# Patient Record
Sex: Female | Born: 1960 | Race: White | Hispanic: No | Marital: Married | Smoking: Never smoker
Health system: Southern US, Community
[De-identification: ages and names within clinical notes are randomized; demographics above are authoritative.]

## PROBLEM LIST (undated history)

## (undated) DIAGNOSIS — J988 Other specified respiratory disorders: Secondary | ICD-10-CM

## (undated) DIAGNOSIS — E559 Vitamin D deficiency, unspecified: Secondary | ICD-10-CM

## (undated) DIAGNOSIS — M1990 Unspecified osteoarthritis, unspecified site: Secondary | ICD-10-CM

## (undated) DIAGNOSIS — H6690 Otitis media, unspecified, unspecified ear: Secondary | ICD-10-CM

## (undated) DIAGNOSIS — M25559 Pain in unspecified hip: Secondary | ICD-10-CM

## (undated) DIAGNOSIS — K5900 Constipation, unspecified: Secondary | ICD-10-CM

## (undated) DIAGNOSIS — N390 Urinary tract infection, site not specified: Secondary | ICD-10-CM

## (undated) DIAGNOSIS — L0390 Cellulitis, unspecified: Secondary | ICD-10-CM

## (undated) DIAGNOSIS — J329 Chronic sinusitis, unspecified: Secondary | ICD-10-CM

## (undated) DIAGNOSIS — E663 Overweight: Secondary | ICD-10-CM

## (undated) DIAGNOSIS — L0291 Cutaneous abscess, unspecified: Secondary | ICD-10-CM

## (undated) DIAGNOSIS — I10 Essential (primary) hypertension: Secondary | ICD-10-CM

## (undated) DIAGNOSIS — G4700 Insomnia, unspecified: Secondary | ICD-10-CM

## (undated) DIAGNOSIS — F419 Anxiety disorder, unspecified: Secondary | ICD-10-CM

## (undated) HISTORY — DX: Urinary tract infection, site not specified: N39.0

## (undated) HISTORY — DX: Pain in unspecified hip: M25.559

## (undated) HISTORY — DX: Unspecified osteoarthritis, unspecified site: M19.90

## (undated) HISTORY — DX: Essential (primary) hypertension: I10

## (undated) HISTORY — DX: Otitis media, unspecified, unspecified ear: H66.90

## (undated) HISTORY — PX: LIPOSUCTION: SHX10

## (undated) HISTORY — DX: Chronic sinusitis, unspecified: J32.9

## (undated) HISTORY — DX: Cutaneous abscess, unspecified: L02.91

## (undated) HISTORY — DX: Anxiety disorder, unspecified: F41.9

## (undated) HISTORY — PX: REPLACEMENT TOTAL HIP W/  RESURFACING IMPLANTS: SUR1222

## (undated) HISTORY — PX: TUBAL LIGATION: SHX77

## (undated) HISTORY — DX: Insomnia, unspecified: G47.00

## (undated) HISTORY — DX: Other specified respiratory disorders: J98.8

## (undated) HISTORY — DX: Constipation, unspecified: K59.00

## (undated) HISTORY — DX: Vitamin D deficiency, unspecified: E55.9

## (undated) HISTORY — DX: Cellulitis, unspecified: L03.90

## (undated) HISTORY — DX: Overweight: E66.3

---

## 1997-07-06 ENCOUNTER — Other Ambulatory Visit: Admission: RE | Admit: 1997-07-06 | Discharge: 1997-07-06 | Payer: Self-pay | Admitting: Obstetrics and Gynecology

## 2001-06-16 ENCOUNTER — Other Ambulatory Visit: Admission: RE | Admit: 2001-06-16 | Discharge: 2001-06-16 | Payer: Self-pay | Admitting: Obstetrics & Gynecology

## 2011-03-23 ENCOUNTER — Encounter: Payer: Self-pay | Admitting: *Deleted

## 2011-03-23 ENCOUNTER — Encounter: Payer: Self-pay | Admitting: Cardiovascular Disease

## 2011-03-26 ENCOUNTER — Ambulatory Visit (INDEPENDENT_AMBULATORY_CARE_PROVIDER_SITE_OTHER): Payer: BC Managed Care – PPO | Admitting: Cardiovascular Disease

## 2011-03-26 ENCOUNTER — Encounter: Payer: Self-pay | Admitting: Cardiovascular Disease

## 2011-03-26 DIAGNOSIS — F411 Generalized anxiety disorder: Secondary | ICD-10-CM

## 2011-03-26 DIAGNOSIS — R079 Chest pain, unspecified: Secondary | ICD-10-CM | POA: Insufficient documentation

## 2011-03-26 DIAGNOSIS — F419 Anxiety disorder, unspecified: Secondary | ICD-10-CM

## 2011-03-26 DIAGNOSIS — Z7289 Other problems related to lifestyle: Secondary | ICD-10-CM

## 2011-03-26 NOTE — Patient Instructions (Addendum)
Your physician recommends that you schedule a follow-up appointment in: AS NEEDED  Your physician recommends that you continue on your current medications as directed. Please refer to the Current Medication list given to you today.  Your physician has requested that you have a stress echocardiogram. For further information please visit [email or web address]. Please follow instruction sheet as given. DX CHEST PAIN  

## 2011-03-26 NOTE — Progress Notes (Signed)
Patient ID: Brenda Holloway, female   DOB: December 16, 1960, 51 y.o.   MRN: [id number] 51 yo referred by Mittie Bodo PA at [facility].  Anxiety, chest pain last two weeks.  Drinks wine every day.  Pain atypical. Piercing intermitant.  Can last up to 30 minutes.  Work load at [employer] is bad.  Chronic anxiety on BZ;s.  Pain is left and right sided.  Not related to exertion.  At least 3 episodes last two weeks.  More of a dull ache today.  No pleuritic component.  No trauma.  No associated dyspnea or palpitations.  Reviewed notes from 2/20 [facility]  ROS: Denies fever, malais, weight loss, blurry vision, decreased visual acuity, cough, sputum, SOB, hemoptysis, pleuritic pain, palpitaitons, heartburn, abdominal pain, melena, lower extremity edema, claudication, or rash.  All other systems reviewed and negative   General: Affect appropriate Healthy:  appears stated age HEENT: normal Neck supple with no adenopathy JVP normal no bruits no thyromegaly Lungs clear with no wheezing and good diaphragmatic motion Heart:  S1/S2 no murmur,rub, gallop or click PMI normal Abdomen: benighn, BS positve, no tenderness, no AAA no bruit.  No HSM or HJR Distal pulses intact with no bruits No edema Neuro non-focal Skin warm and dry No muscular weakness  Medications Current Outpatient Prescriptions  Medication Sig Dispense Refill  . zolpidem (AMBIEN) 10 MG tablet Take 10 mg by mouth at bedtime as needed.        Allergies Prozac  Family History: No family history on file.  Social History: History   Social History  . Marital Status: Married    Spouse Name: N/A    Number of Children: N/A  . Years of Education: N/A   Occupational History  . Not on file.   Social History Main Topics  . Smoking status: Never Smoker   . Smokeless tobacco: Not on file  . Alcohol Use: Not on file  . Drug Use: Not on file  . Sexually Active: Not on file   Other Topics Concern  . Not on file   Social History  Narrative  . No narrative on file    Electrocardiogram:  NSR rate 83  Nonspecific T wave abnormality.  Assessment and Plan

## 2011-03-26 NOTE — Assessment & Plan Note (Signed)
Primary issue. Consider Cymbalta in addition to BZ;s

## 2011-03-26 NOTE — Assessment & Plan Note (Signed)
Discussed excessive nature of daily wine intake with patient.  She does not percieve it as an issue and offers much stress relief from job

## 2011-03-26 NOTE — Assessment & Plan Note (Signed)
Atypical ECG and exam benign  F/U stress echo

## 2011-04-02 NOTE — Progress Notes (Signed)
Addended by: Judithe Modest D on: 04/02/2011 11:11 AM   Modules accepted: Orders

## 2011-04-06 ENCOUNTER — Other Ambulatory Visit (HOSPITAL_COMMUNITY): Payer: BC Managed Care – PPO

## 2016-06-11 ENCOUNTER — Encounter (HOSPITAL_COMMUNITY): Payer: Self-pay

## 2016-06-11 ENCOUNTER — Emergency Department (HOSPITAL_COMMUNITY)
Admission: EM | Admit: 2016-06-11 | Discharge: 2016-06-11 | Disposition: A | Discharge status: Home / Self Care | Payer: 59 | Attending: Emergency Medicine | Admitting: Emergency Medicine

## 2016-06-11 DIAGNOSIS — L03116 Cellulitis of left lower limb: Secondary | ICD-10-CM | POA: Insufficient documentation

## 2016-06-11 DIAGNOSIS — Z79899 Other long term (current) drug therapy: Secondary | ICD-10-CM | POA: Insufficient documentation

## 2016-06-11 DIAGNOSIS — L03032 Cellulitis of left toe: Secondary | ICD-10-CM

## 2016-06-11 DIAGNOSIS — M7989 Other specified soft tissue disorders: Secondary | ICD-10-CM | POA: Diagnosis present

## 2016-06-11 MED ORDER — CEPHALEXIN 500 MG PO CAPS: 500.0000 mg | ORAL_CAPSULE | Freq: Three times a day (TID) | ORAL | 0 refills | Status: DC

## 2016-06-11 MED ORDER — CEPHALEXIN 500 MG PO CAPS
500.0000 mg | ORAL_CAPSULE | Freq: Once | ORAL | Status: AC
  Administered 2016-06-11: 500 mg via ORAL
  Filled 2016-06-11: qty 1

## 2016-06-11 NOTE — ED Provider Notes (Signed)
[facility]-EMERGENCY DEPT Provider Note   CSN: [id number] Arrival date & time: 06/11/16  0405     History   Chief Complaint Chief Complaint  Patient presents with  . Wound Infection    HPI Brenda Holloway is a 56 y.o. female.  The history is provided by the patient.   Patient presents to the emergency room complaining of pain to the left second digit.  She states that she feels like she may have been bitten by something.  She's now developed some redness and swelling of the left toe and there is an area which she states is turning black.  She also is concerned about new redness extending onto the dorsum of her foot.  She is not remember seeing anything bite her but remembers waking up in the middle night with discomfort and pain suddenly in the left second toe.  Her pain is moderate in severity.  No fevers or chills.  No other complaints   Past Medical History:  Diagnosis Date  . Anxiety   . Cellulitis and abscess of unspecified site   . Chest pain   . Insomnia   . Respiratory infection   . Unspecified otitis media   . Unspecified sinusitis (chronic)   . UTI (lower urinary tract infection)     Patient Active Problem List   Diagnosis Date Noted  . Chest pain 03/26/2011  . Anxiety 03/26/2011  . Alcohol use 03/26/2011    Past Surgical History:  Procedure Laterality Date  . CESAREAN SECTION    . TUBAL LIGATION      OB History    No data available       Home Medications    Prior to Admission medications   Medication Sig Start Date End Date Taking? Authorizing Provider  benzonatate (TESSALON) 100 MG capsule Take 100 mg by mouth every 8 (eight) hours as needed for cough.  06/07/16  Yes [provider]  diphenhydrAMINE (SOMINEX) 25 MG tablet Take 50 mg by mouth at bedtime as needed for itching or sleep.   Yes [provider]  fluticasone (FLONASE) 50 MCG/ACT nasal spray Place 1 spray into both nostrils daily. 06/07/16  Yes [provider]    lisinopril (PRINIVIL,ZESTRIL) 10 MG tablet Take 10 mg by mouth every morning. 04/15/16  Yes [provider]  montelukast (SINGULAIR) 10 MG tablet Take 10 mg by mouth at bedtime. 06/07/16  Yes [provider]  cephALEXin (KEFLEX) 500 MG capsule Take 1 capsule (500 mg total) by mouth 3 (three) times daily. 06/11/16   Azalia Bilis, MD    Family History History reviewed. No pertinent family history.  Social History Social History  Substance Use Topics  . Smoking status: Never Smoker  . Smokeless tobacco: Never Used  . Alcohol use Not on file     Allergies   Prozac [fluoxetine hcl]   Review of Systems Review of Systems  All other systems reviewed and are negative.    Physical Exam Updated Vital Signs BP 138/85 (BP Location: Left Arm)   Pulse 72   Temp 98.7 F (37.1 C) (Oral)   Resp 18   Ht 5\' 3"  (1.6 m)   Wt 190 lb (86.2 kg)   SpO2 98%   BMI 33.66 kg/m   Physical Exam  Constitutional: She is oriented to person, place, and time. She appears well-developed and well-nourished.  HENT:  Head: Normocephalic.  Eyes: EOM are normal.  Neck: Normal range of motion.  Pulmonary/Chest: Effort normal.  Abdominal: She  exhibits no distension.  Musculoskeletal: Normal range of motion.  Erythema and swelling of the left second toe with small area of darkening and possible early necrosis on the lateral aspect of the left second toe.  Very mild erythema on the dorsum of the foot.  Normal pulses in left foot.  No drainage from the left second toe.  Nail is normal  Neurological: She is alert and oriented to person, place, and time.  Psychiatric: She has a normal mood and affect.  Nursing note and vitals reviewed.    ED Treatments / Results  Labs (all labs ordered are listed, but only abnormal results are displayed) Labs Reviewed - No data to display  EKG  EKG Interpretation None       Radiology No results found.  Procedures Procedures (including critical  care time)  Medications Ordered in ED Medications  cephALEXin (KEFLEX) capsule 500 mg (not administered)     Initial Impression / Assessment and Plan / ED Course  I have reviewed the triage vital signs and the nursing notes.  Pertinent labs & imaging results that were available during my care of the patient were reviewed by me and considered in my medical decision making (see chart for details).     Suspect localized allergic reaction and possible spider bite.  No signs to suggest abscess at this time.  Mild spreading erythema.  Patient be started on antibiotics.  She understands to return to the ER for new or worsening symptoms  Final Clinical Impressions(s) / ED Diagnoses   Final diagnoses:  Cellulitis of toe of left foot    New Prescriptions New Prescriptions   CEPHALEXIN (KEFLEX) 500 MG CAPSULE    Take 1 capsule (500 mg total) by mouth 3 (three) times daily.     Azalia Bilisampos, Uma Jerde, MD 06/11/16 [phone]

## 2016-06-11 NOTE — ED Triage Notes (Signed)
States was bitten she thinks on 2nd digit on left foot and now redness to toe and spreading to left foot area and sore of foot.

## 2016-12-12 DIAGNOSIS — Z6835 Body mass index (BMI) 35.0-35.9, adult: Secondary | ICD-10-CM | POA: Diagnosis not present

## 2016-12-12 DIAGNOSIS — Z1231 Encounter for screening mammogram for malignant neoplasm of breast: Secondary | ICD-10-CM | POA: Diagnosis not present

## 2016-12-12 DIAGNOSIS — Z1151 Encounter for screening for human papillomavirus (HPV): Secondary | ICD-10-CM | POA: Diagnosis not present

## 2016-12-12 DIAGNOSIS — Z01419 Encounter for gynecological examination (general) (routine) without abnormal findings: Secondary | ICD-10-CM | POA: Diagnosis not present

## 2017-11-21 DIAGNOSIS — L814 Other melanin hyperpigmentation: Secondary | ICD-10-CM | POA: Diagnosis not present

## 2017-11-21 DIAGNOSIS — D2271 Melanocytic nevi of right lower limb, including hip: Secondary | ICD-10-CM | POA: Diagnosis not present

## 2017-11-21 DIAGNOSIS — D485 Neoplasm of uncertain behavior of skin: Secondary | ICD-10-CM | POA: Diagnosis not present

## 2017-11-21 DIAGNOSIS — L570 Actinic keratosis: Secondary | ICD-10-CM | POA: Diagnosis not present

## 2017-11-29 DIAGNOSIS — Z86010 Personal history of colonic polyps: Secondary | ICD-10-CM | POA: Diagnosis not present

## 2017-11-29 DIAGNOSIS — D123 Benign neoplasm of transverse colon: Secondary | ICD-10-CM | POA: Diagnosis not present

## 2017-11-29 DIAGNOSIS — K6389 Other specified diseases of intestine: Secondary | ICD-10-CM | POA: Diagnosis not present

## 2017-11-29 DIAGNOSIS — D122 Benign neoplasm of ascending colon: Secondary | ICD-10-CM | POA: Diagnosis not present

## 2017-12-03 DIAGNOSIS — D122 Benign neoplasm of ascending colon: Secondary | ICD-10-CM | POA: Diagnosis not present

## 2017-12-03 DIAGNOSIS — D123 Benign neoplasm of transverse colon: Secondary | ICD-10-CM | POA: Diagnosis not present

## 2018-02-11 DIAGNOSIS — D485 Neoplasm of uncertain behavior of skin: Secondary | ICD-10-CM | POA: Diagnosis not present

## 2018-02-11 DIAGNOSIS — L905 Scar conditions and fibrosis of skin: Secondary | ICD-10-CM | POA: Diagnosis not present

## 2018-03-05 ENCOUNTER — Ambulatory Visit: Payer: 59 | Admitting: Family Medicine

## 2018-03-06 DIAGNOSIS — F411 Generalized anxiety disorder: Secondary | ICD-10-CM | POA: Diagnosis not present

## 2018-03-06 DIAGNOSIS — R0789 Other chest pain: Secondary | ICD-10-CM | POA: Diagnosis not present

## 2018-03-06 DIAGNOSIS — I10 Essential (primary) hypertension: Secondary | ICD-10-CM | POA: Diagnosis not present

## 2018-03-31 DIAGNOSIS — Z1231 Encounter for screening mammogram for malignant neoplasm of breast: Secondary | ICD-10-CM | POA: Diagnosis not present

## 2018-03-31 DIAGNOSIS — Z6835 Body mass index (BMI) 35.0-35.9, adult: Secondary | ICD-10-CM | POA: Diagnosis not present

## 2018-03-31 DIAGNOSIS — Z01419 Encounter for gynecological examination (general) (routine) without abnormal findings: Secondary | ICD-10-CM | POA: Diagnosis not present

## 2018-04-02 DIAGNOSIS — I10 Essential (primary) hypertension: Secondary | ICD-10-CM | POA: Diagnosis not present

## 2018-04-02 DIAGNOSIS — F418 Other specified anxiety disorders: Secondary | ICD-10-CM | POA: Diagnosis not present

## 2018-04-10 ENCOUNTER — Encounter (INDEPENDENT_AMBULATORY_CARE_PROVIDER_SITE_OTHER): Payer: Self-pay

## 2018-04-10 ENCOUNTER — Other Ambulatory Visit: Payer: Self-pay

## 2018-04-15 ENCOUNTER — Ambulatory Visit (INDEPENDENT_AMBULATORY_CARE_PROVIDER_SITE_OTHER): Payer: Self-pay | Admitting: Bariatrics

## 2018-04-23 ENCOUNTER — Ambulatory Visit (INDEPENDENT_AMBULATORY_CARE_PROVIDER_SITE_OTHER): Payer: Self-pay | Admitting: Bariatrics

## 2018-04-30 DIAGNOSIS — F418 Other specified anxiety disorders: Secondary | ICD-10-CM | POA: Diagnosis not present

## 2018-04-30 DIAGNOSIS — I10 Essential (primary) hypertension: Secondary | ICD-10-CM | POA: Diagnosis not present

## 2018-05-07 ENCOUNTER — Ambulatory Visit (INDEPENDENT_AMBULATORY_CARE_PROVIDER_SITE_OTHER): Payer: Self-pay | Admitting: Bariatrics

## 2018-10-29 DIAGNOSIS — Z20828 Contact with and (suspected) exposure to other viral communicable diseases: Secondary | ICD-10-CM | POA: Diagnosis not present

## 2018-11-13 ENCOUNTER — Other Ambulatory Visit: Payer: Self-pay

## 2018-11-13 ENCOUNTER — Encounter: Payer: Self-pay | Admitting: Bariatrics

## 2018-11-13 ENCOUNTER — Ambulatory Visit (INDEPENDENT_AMBULATORY_CARE_PROVIDER_SITE_OTHER): Payer: BC Managed Care – PPO | Admitting: Bariatrics

## 2018-11-13 ENCOUNTER — Encounter (INDEPENDENT_AMBULATORY_CARE_PROVIDER_SITE_OTHER): Payer: Self-pay | Admitting: Bariatrics

## 2018-11-13 VITALS — BP 143/90 | HR 75 | Temp 97.7°F | Ht 62.0 in | Wt 201.0 lb

## 2018-11-13 DIAGNOSIS — M25551 Pain in right hip: Secondary | ICD-10-CM | POA: Diagnosis not present

## 2018-11-13 DIAGNOSIS — R0602 Shortness of breath: Secondary | ICD-10-CM | POA: Diagnosis not present

## 2018-11-13 DIAGNOSIS — Z9189 Other specified personal risk factors, not elsewhere classified: Secondary | ICD-10-CM | POA: Diagnosis not present

## 2018-11-13 DIAGNOSIS — Z1331 Encounter for screening for depression: Secondary | ICD-10-CM | POA: Diagnosis not present

## 2018-11-13 DIAGNOSIS — I10 Essential (primary) hypertension: Secondary | ICD-10-CM | POA: Diagnosis not present

## 2018-11-13 DIAGNOSIS — R5383 Other fatigue: Secondary | ICD-10-CM | POA: Diagnosis not present

## 2018-11-13 DIAGNOSIS — Z0289 Encounter for other administrative examinations: Secondary | ICD-10-CM

## 2018-11-13 DIAGNOSIS — Z6836 Body mass index (BMI) 36.0-36.9, adult: Secondary | ICD-10-CM

## 2018-11-14 LAB — COMPREHENSIVE METABOLIC PANEL
ALT: 43 IU/L — ABNORMAL HIGH (ref 0–32)
AST: 36 IU/L (ref 0–40)
Albumin/Globulin Ratio: 2.1 (ref 1.2–2.2)
Albumin: 4.8 g/dL (ref 3.8–4.9)
Alkaline Phosphatase: 78 IU/L (ref 39–117)
BUN/Creatinine Ratio: 14 (ref 9–23)
BUN: 11 mg/dL (ref 6–24)
Bilirubin Total: 0.4 mg/dL (ref 0.0–1.2)
CO2: 26 mmol/L (ref 20–29)
Calcium: 10.1 mg/dL (ref 8.7–10.2)
Chloride: 95 mmol/L — ABNORMAL LOW (ref 96–106)
Creatinine, Ser: 0.81 mg/dL (ref 0.57–1.00)
GFR calc Af Amer: 92 mL/min/{1.73_m2} (ref >59)
GFR calc non Af Amer: 80 mL/min/{1.73_m2} (ref >59)
Globulin, Total: 2.3 g/dL (ref 1.5–4.5)
Glucose: 99 mg/dL (ref 65–99)
Potassium: 3.1 mmol/L — ABNORMAL LOW (ref 3.5–5.2)
Sodium: 138 mmol/L (ref 134–144)
Total Protein: 7.1 g/dL (ref 6.0–8.5)

## 2018-11-14 LAB — LIPID PANEL WITH LDL/HDL RATIO
Cholesterol, Total: 250 mg/dL — ABNORMAL HIGH (ref 100–199)
HDL: 81 mg/dL (ref >39)
LDL Chol Calc (NIH): 129 mg/dL — ABNORMAL HIGH (ref 0–99)
LDL/HDL Ratio: 1.6 ratio (ref 0.0–3.2)
Triglycerides: 229 mg/dL — ABNORMAL HIGH (ref 0–149)
VLDL Cholesterol Cal: 40 mg/dL (ref 5–40)

## 2018-11-14 LAB — T3: T3, Total: 103 ng/dL (ref 71–180)

## 2018-11-14 LAB — VITAMIN D 25 HYDROXY (VIT D DEFICIENCY, FRACTURES): Vit D, 25-Hydroxy: 27.4 ng/mL — ABNORMAL LOW (ref 30.0–100.0)

## 2018-11-14 LAB — INSULIN, RANDOM: INSULIN: 13.1 u[IU]/mL (ref 2.6–24.9)

## 2018-11-14 LAB — T4, FREE: Free T4: 1.23 ng/dL (ref 0.82–1.77)

## 2018-11-14 LAB — TSH: TSH: 1.25 u[IU]/mL (ref 0.450–4.500)

## 2018-11-14 LAB — HEMOGLOBIN A1C
Est. average glucose Bld gHb Est-mCnc: 105 mg/dL
Hgb A1c MFr Bld: 5.3 % (ref 4.8–5.6)

## 2018-11-17 ENCOUNTER — Encounter (INDEPENDENT_AMBULATORY_CARE_PROVIDER_SITE_OTHER): Payer: Self-pay | Admitting: Bariatrics

## 2018-11-17 DIAGNOSIS — E559 Vitamin D deficiency, unspecified: Secondary | ICD-10-CM | POA: Insufficient documentation

## 2018-11-17 DIAGNOSIS — E8881 Metabolic syndrome: Secondary | ICD-10-CM | POA: Insufficient documentation

## 2018-11-18 ENCOUNTER — Encounter (INDEPENDENT_AMBULATORY_CARE_PROVIDER_SITE_OTHER): Payer: Self-pay | Admitting: Bariatrics

## 2018-11-18 NOTE — Progress Notes (Signed)
.  Office: [phone]  /  Fax: [phone]   HPI:   Chief Complaint: OBESITY  Brenda Holloway (MR# [id number]) is a 58 y.o. female who presents on 11/18/2018 for obesity evaluation and treatment. Current BMI is Body mass index is 36.76 kg/m.Marland Kitchen. Brenda Holloway has struggled with obesity for years and has been unsuccessful in either losing weight or maintaining long term weight loss. Brenda Holloway attended our information session and states she is currently in the action stage of change and ready to dedicate time achieving and maintaining a healthier weight.   Diane does not cook usually, but her spouse loves to cook. She craves cheese, crackers with wine. She dislikes cottage cheese, hotdogs, pastrami, black olives, and smoothies.  Brenda Holloway states her family eats meals together her desired weight loss is 70 lbs she started gaining weight between the ages of [age] and [age] her heaviest weight ever was 205 lbs. she has significant food cravings issues  she sometimes makes poor food choices she sometimes has problems with excessive hunger  she frequently eats larger portions than normal  she has binge eating behaviors she struggles with emotional eating    Fatigue Brenda Holloway feels her energy is lower than it should be. This has worsened with weight gain and has worsened recently. Brenda Holloway denies daytime somnolence and  admits to waking up still tired. Patient is at risk for obstructive sleep apnea. Patient generally gets 7-8 hours of sleep per night, and states they generally have restful sleep. Snoring is present. Apneic episodes are not present. Epworth Sleepiness Score is 3.  Dyspnea on exertion Brenda Holloway notes increasing shortness of breath with exercising and seems to be worsening over time with weight gain. She notes getting out of breath sooner with activity than she used to. This has gotten worse recently. Brenda Holloway denies orthopnea.  Hypertension Brenda BalDiana C Biswas is a 58 y.o. female with hypertension and is taking  chlorthalidone.  Brenda BalDiana C Korson denies chest pain or shortness of breath on exertion. She is working weight loss to help control her blood pressure with the goal of decreasing her risk of heart attack and stroke. Lynnix's blood pressure is reasonably well controlled. She takes no blood pressure medication in the AM.  At risk for cardiovascular disease Brenda Holloway is at a higher than average risk for cardiovascular disease due to obesity. She currently denies any chest pain.  Hip Pain, Right Brenda Holloway has right hip pain with lower sciatic nerve pain.  Depression Screen Temperence's Food and Mood (modified PHQ-9) score was 7. Depression screen PHQ 2/9 11/13/2018  Decreased Interest 1  Down, Depressed, Hopeless 1  PHQ - 2 Score 2  Altered sleeping 1  Tired, decreased energy 2  Change in appetite 1  Feeling bad or failure about yourself  1  Trouble concentrating 0  Moving slowly or fidgety/restless 0  Suicidal thoughts 0  PHQ-9 Score 7  Difficult doing work/chores Not difficult at all    ASSESSMENT AND PLAN:  Other fatigue - Plan: EKG 12-Lead, T3, T4, free, TSH, VITAMIN D 25 Hydroxy (Vit-D Deficiency, Fractures), Hemoglobin A1c, Insulin, random, Comprehensive metabolic panel  Shortness of breath on exertion - Plan: Lipid Panel With LDL/HDL Ratio  Essential hypertension  Right hip pain  Depression screening  At risk for heart disease  Class 2 severe obesity with serious comorbidity and body mass index (BMI) of 36.0 to 36.9 in adult, unspecified obesity type (HCC)  PLAN:  Fatigue Brenda Holloway was informed that her fatigue may be related to obesity, depression  or many other causes. Labs will be ordered, and in the meanwhile Patrisia has agreed to work on diet, exercise and weight loss to help with fatigue. Proper sleep hygiene was discussed including the need for 7-8 hours of quality sleep each night. A sleep study was not ordered based on symptoms and Epworth score. Alexandrea will gradually increase  activities.  Dyspnea on exertion Kendra's shortness of breath appears to be obesity related and exercise induced. She has agreed to work on weight loss and gradually increase exercise to treat her exercise induced shortness of breath. If Rachna follows our instructions and loses weight without improvement of her shortness of breath, we will plan to refer to pulmonology. We will monitor this condition regularly. Maila agrees to this plan.  Hypertension We discussed sodium restriction, working on healthy weight loss, and a regular exercise program as the means to achieve improved blood pressure control. Matie agreed with this plan and agreed to follow up as directed. We will continue to monitor her blood pressure as well as her progress with the above lifestyle modifications. She will continue her medications as prescribed and will watch for signs of hypotension as she continues her lifestyle modifications.  Cardiovascular risk counseling Imanie was given extended (15 minutes) coronary artery disease prevention counseling today. She is 58 y.o. female and has risk factors for heart disease including obesity. We discussed intensive lifestyle modifications today with an emphasis on specific weight loss instructions and strategies. Pt was also informed of the importance of increasing exercise and decreasing saturated fats to help prevent heart disease.  Hip Pain, Right Abella will see Orthopedics in the near future.  Depression Screen Michole had a mildly positive depression screening. Depression is commonly associated with obesity and often results in emotional eating behaviors. We will monitor this closely and work on CBT to help improve the non-hunger eating patterns. Referral to Psychology may be required if no improvement is seen as she continues in our clinic.  Obesity Delayza is currently in the action stage of change and her goal is to continue with weight loss efforts. She has agreed to follow the  Category 3 plan with additional Category 3 breakfast options. Jermani will work on meal planning, intentional eating, and will slow down her eating (put utensils down). Morena has been instructed to work up to a goal of 150 minutes of combined cardio and strengthening exercise per week for weight loss and overall health benefits. We discussed the following Behavioral Modification Strategies today: increasing lean protein intake, decreasing simple carbohydrates, increasing vegetables, increase H20 intake, decrease eating out, no skipping meals, work on meal planning and easy cooking plans, keeping healthy foods in the home, and planning for success.  Shermeka has agreed to follow up with our clinic in 2 weeks. She was informed of the importance of frequent follow up visits to maximize her success with intensive lifestyle modifications for her multiple health conditions. She was informed we would discuss her lab results at her next visit unless there is a critical issue that needs to be addressed sooner. Kesa agreed to keep her next visit at the agreed upon time to discuss these results.  ALLERGIES: Allergies  Allergen Reactions  . Prozac [Fluoxetine Hcl] Shortness Of Breath    MEDICATIONS: Current Outpatient Medications on File Prior to Visit  Medication Sig Dispense Refill  . ALPRAZolam (XANAX) 0.5 MG tablet Take 0.5 mg by mouth at bedtime as needed for anxiety.    . chlorthalidone (HYGROTON) 25 MG tablet  Take 25 mg by mouth daily.    Marland Kitchen loratadine (CLARITIN) 10 MG tablet Take 10 mg by mouth daily.     No current facility-administered medications on file prior to visit.     PAST MEDICAL HISTORY: Past Medical History:  Diagnosis Date  . Anxiety   . Cellulitis and abscess of unspecified site   . Chest pain   . Hip pain   . Insomnia   . Respiratory infection   . Unspecified otitis media   . Unspecified sinusitis (chronic)   . UTI (lower urinary tract infection)     PAST SURGICAL HISTORY:  Past Surgical History:  Procedure Laterality Date  . CESAREAN SECTION    . LIPOSUCTION    . TUBAL LIGATION      SOCIAL HISTORY: Social History   Tobacco Use  . Smoking status: Never Smoker  . Smokeless tobacco: Never Used  Substance Use Topics  . Alcohol use: Not on file  . Drug use: Not on file    FAMILY HISTORY: Family History  Problem Relation Age of Onset  . High blood pressure Mother   . Heart disease Mother   . Stroke Mother   . Anxiety disorder Mother   . Cancer Father    ROS: Review of Systems  Eyes:       Positive for wearing glasses or contacts.  Respiratory: Negative for shortness of breath.   Cardiovascular: Negative for chest pain and orthopnea.  Gastrointestinal: Positive for heartburn (occasional).  Musculoskeletal: Positive for joint pain (right hip).   PHYSICAL EXAM: Blood pressure (!) 143/90, pulse 75, temperature 97.7 F (36.5 C), temperature source Oral, height 5\' 2"  (1.575 m), weight 201 lb (91.2 kg), last menstrual period 01/29/2009, SpO2 99 %. Body mass index is 36.76 kg/m. Physical Exam Vitals signs reviewed.  Constitutional:      Appearance: Normal appearance. She is well-developed. She is obese.  HENT:     Head: Normocephalic and atraumatic.     Nose: Nose normal.  Eyes:     General: No scleral icterus. Neck:     Musculoskeletal: Normal range of motion.  Cardiovascular:     Rate and Rhythm: Normal rate and regular rhythm.  Pulmonary:     Effort: Pulmonary effort is normal. No respiratory distress.  Abdominal:     Palpations: Abdomen is soft.     Tenderness: There is no abdominal tenderness.  Musculoskeletal: Normal range of motion.     Comments: Range of motion normal in all four extremities. Positive for right hip pain.  Skin:    General: Skin is warm and dry.  Neurological:     Mental Status: She is alert and oriented to person, place, and time.     Coordination: Coordination normal.  Psychiatric:        Mood and Affect:  Mood and affect normal.        Behavior: Behavior normal.   RECENT LABS AND TESTS: BMET    Component Value Date/Time   NA 138 11/13/2018 0832   K 3.1 (L) 11/13/2018 0832   CL 95 (L) 11/13/2018 0832   CO2 26 11/13/2018 0832   GLUCOSE 99 11/13/2018 0832   BUN 11 11/13/2018 0832   CREATININE 0.81 11/13/2018 0832   CALCIUM 10.1 11/13/2018 0832   GFRNONAA 80 11/13/2018 0832   GFRAA 92 11/13/2018 0832   Lab Results  Component Value Date   HGBA1C 5.3 11/13/2018   Lab Results  Component Value Date   INSULIN 13.1 11/13/2018  CBC No results found for: WBC, RBC, HGB, HCT, PLT, MCV, MCH, MCHC, RDW, LYMPHSABS, MONOABS, EOSABS, BASOSABS Iron/TIBC/Ferritin/ %Sat No results found for: IRON, TIBC, FERRITIN, IRONPCTSAT Lipid Panel     Component Value Date/Time   CHOL 250 (H) 11/13/2018 0832   TRIG 229 (H) 11/13/2018 0832   HDL 81 11/13/2018 0832   LDLCALC 129 (H) 11/13/2018 0832   Hepatic Function Panel     Component Value Date/Time   PROT 7.1 11/13/2018 0832   ALBUMIN 4.8 11/13/2018 0832   AST 36 11/13/2018 0832   ALT 43 (H) 11/13/2018 0832   ALKPHOS 78 11/13/2018 0832   BILITOT 0.4 11/13/2018 0832      Component Value Date/Time   TSH 1.250 11/13/2018 0832   No results found for: Vitamin D, 25-Hydroxy  ECG shows sinus rhythm with a rate of 75 BPM. Low voltage in precordial leads most likely related to body habitus. Nonspecific T-abnormality.   INDIRECT CALORIMETER done today shows a VO2 of 263 and a REE of 1837. Her calculated basal metabolic rate is 4782 thus her basal metabolic rate is better than expected.  OBESITY BEHAVIORAL INTERVENTION VISIT  Today's visit was #1  Starting weight: 201 lbs Starting date: 11/13/2018 Today's weight: 201 lbs  Today's date: 11/13/2018 Total lbs lost to date: 0    11/13/2018  Height  (1.575 m)  Weight 201 lb (91.2 kg)  BMI (Calculated) 36.75  BLOOD PRESSURE - SYSTOLIC 143  BLOOD PRESSURE - DIASTOLIC 90  Waist  Measurement  44 inches   Body Fat % 45.9 %  Total Body Water (lbs) 71.6 lbs  RMR 1837   ASK: We discussed the diagnosis of obesity with Brenda Bal today and Shelene agreed to give [name] permission to discuss obesity behavioral modification therapy today.  ASSESS: Ottilia has the diagnosis of obesity and her BMI today is 36.9. Katerina is in the action stage of change.   ADVISE: Olivia was educated on the multiple health risks of obesity as well as the benefit of weight loss to improve her health. She was advised of the need for long term treatment and the importance of lifestyle modifications to improve her current health and to decrease her risk of future health problems.  AGREE: Multiple dietary modification options and treatment options were discussed and  Aynslee agreed to follow the recommendations documented in the above note.  ARRANGE: Elsie was educated on the importance of frequent visits to treat obesity as outlined per CMS and USPSTF guidelines and agreed to schedule her next follow up appointment today.   Fernanda Drum, am acting as Energy manager for [employer], DO   I have reviewed the above documentation for accuracy and completeness, and I agree with the above. -Corinna Capra, DO

## 2018-11-19 DIAGNOSIS — M25551 Pain in right hip: Secondary | ICD-10-CM | POA: Diagnosis not present

## 2018-11-21 DIAGNOSIS — F418 Other specified anxiety disorders: Secondary | ICD-10-CM | POA: Diagnosis not present

## 2018-11-21 DIAGNOSIS — I10 Essential (primary) hypertension: Secondary | ICD-10-CM | POA: Diagnosis not present

## 2018-11-27 ENCOUNTER — Ambulatory Visit (INDEPENDENT_AMBULATORY_CARE_PROVIDER_SITE_OTHER): Payer: BC Managed Care – PPO | Admitting: Bariatrics

## 2018-11-27 ENCOUNTER — Encounter (INDEPENDENT_AMBULATORY_CARE_PROVIDER_SITE_OTHER): Payer: Self-pay | Admitting: Bariatrics

## 2018-11-27 ENCOUNTER — Other Ambulatory Visit: Payer: Self-pay

## 2018-11-27 VITALS — BP 154/96 | HR 69 | Temp 98.0°F | Ht 62.0 in | Wt 193.0 lb

## 2018-11-27 DIAGNOSIS — E8881 Metabolic syndrome: Secondary | ICD-10-CM | POA: Diagnosis not present

## 2018-11-27 DIAGNOSIS — L814 Other melanin hyperpigmentation: Secondary | ICD-10-CM | POA: Diagnosis not present

## 2018-11-27 DIAGNOSIS — D225 Melanocytic nevi of trunk: Secondary | ICD-10-CM | POA: Diagnosis not present

## 2018-11-27 DIAGNOSIS — L570 Actinic keratosis: Secondary | ICD-10-CM | POA: Diagnosis not present

## 2018-11-27 DIAGNOSIS — I10 Essential (primary) hypertension: Secondary | ICD-10-CM

## 2018-11-27 DIAGNOSIS — Z9189 Other specified personal risk factors, not elsewhere classified: Secondary | ICD-10-CM

## 2018-11-27 DIAGNOSIS — D2271 Melanocytic nevi of right lower limb, including hip: Secondary | ICD-10-CM | POA: Diagnosis not present

## 2018-11-27 DIAGNOSIS — E559 Vitamin D deficiency, unspecified: Secondary | ICD-10-CM

## 2018-11-27 DIAGNOSIS — Z6835 Body mass index (BMI) 35.0-35.9, adult: Secondary | ICD-10-CM

## 2018-11-27 MED ORDER — VITAMIN D (ERGOCALCIFEROL) 1.25 MG (50000 UNIT) PO CAPS: 50000.0000 [IU] | ORAL_CAPSULE | ORAL | 0 refills | Status: DC

## 2018-11-27 MED ORDER — LISINOPRIL 10 MG PO TABS: 10.0000 mg | ORAL_TABLET | Freq: Every day | ORAL | 0 refills | Status: DC

## 2018-12-01 ENCOUNTER — Encounter (INDEPENDENT_AMBULATORY_CARE_PROVIDER_SITE_OTHER): Payer: Self-pay | Admitting: Bariatrics

## 2018-12-01 NOTE — Progress Notes (Signed)
Office: [phone]  /  Fax: [phone]   HPI:   Chief Complaint: OBESITY Brenda Holloway is here to discuss her progress with her obesity treatment plan. She is on the Category 3 plan with breakfast options and is following her eating plan approximately 70 % of the time. She states she is exercising 0 minutes 0 times per week. Brenda Holloway is down 8 lbs and states that she loves the plan.  Her weight is 193 lb (87.5 kg) today and has had a weight loss of 8 pounds over a period of 2 weeks since her last visit. She has lost 8 lbs since starting treatment with [name].  Insulin Resistance Brenda Holloway has a new diagnosis of insulin resistance based on her elevated fasting insulin level >5. Last A1c was 5.3 and insulin of 13.1. Although Brenda Holloway's blood glucose readings are still under good control, insulin resistance puts her at greater risk of metabolic syndrome and diabetes. She is not taking metformin currently and continues to work on diet and exercise to decrease risk of diabetes. She denies polyphagia.  At risk for diabetes Brenda Holloway is at higher than average risk for developing diabetes due to her obesity and insulin resistance. She currently denies polyuria or polydipsia.  Vitamin D Deficiency Brenda Holloway has a new diagnosis of vitamin D deficiency. She is not currently taking Vit D. Last Vit D was 27.4. She denies nausea, vomiting or muscle weakness.  Hypertension Brenda Holloway is a 58 y.o. female with hypertension. Brenda Holloway is taking chlorthalidone and her blood pressure is 154/96. She is working on weight loss to help control her blood pressure with the goal of decreasing her risk of heart attack and stroke.   ASSESSMENT AND PLAN:  Insulin resistance  Vitamin D deficiency - Plan: Vitamin D, Ergocalciferol, (DRISDOL) 1.25 MG (50000 UT) CAPS capsule  Essential hypertension  At risk for diabetes mellitus  Class 2 severe obesity with serious comorbidity and body mass index (BMI) of 35.0 to 35.9 in adult,  unspecified obesity type (HCC)  PLAN:  Insulin Resistance Brenda Holloway will continue to work on weight loss, exercise, increase protein, and decreasing simple carbohydrates in her diet to help decrease the risk of diabetes. She is to increase MUFA's and PUFA's. We dicussed metformin including benefits and risks. She was informed that eating too many simple carbohydrates or too many calories at one sitting increases the likelihood of GI side effects. Annalese agrees to follow up with [name] as directed to monitor her progress.  Diabetes risk counseling Brenda Holloway was given extended (15 minutes) diabetes prevention counseling today. She is 58 y.o. female and has risk factors for diabetes including obesity and insulin resistance. We discussed intensive lifestyle modifications today with an emphasis on weight loss as well as increasing exercise and decreasing simple carbohydrates in her diet.  Vitamin D Deficiency Brenda Holloway was informed that low vitamin D levels contributes to fatigue and are associated with obesity, breast, and colon cancer. Brenda Holloway agrees to start prescription Vit D 50,000 IU every week #4 with no refills. She will follow up for routine testing of vitamin D, at least 2-3 times per year. She was informed of the risk of over-replacement of vitamin D and agrees to not increase her dose unless she discusses this with [name] first. Brenda Holloway agrees to follow up with our clinic in 2 to 3 weeks.  Hypertension We discussed sodium restriction, working on healthy weight loss, and a regular exercise program as the means to achieve improved blood pressure control. Daelynn agreed with  this plan and agreed to follow up as directed. We will continue to monitor her blood pressure as well as her progress with the above lifestyle modifications. Brenda Holloway agrees to continue taking chlorthalidone, and she agrees to start lisinopril 10 mg 1 tablet PO q daily #30 with no refills. She will watch for signs of hypotension as she continues her  lifestyle modifications. Brenda Holloway agrees to follow up with our clinic in 2 to 3 weeks.  Obesity Brenda Holloway is currently in the action stage of change. As such, her goal is to continue with weight loss efforts She has agreed to follow the Category 3 plan with breakfast options Brenda Holloway has been instructed to work up to a goal of 150 minutes of combined cardio and strengthening exercise per week for weight loss and overall health benefits. We discussed the following Behavioral Modification Strategies today: increasing lean protein intake, decreasing simple carbohydrates, increasing vegetables, decrease eating out, work on meal planning and easy cooking plans, increase H20 intake, no skipping meals, keeping healthy foods in the home, and planning for success Brenda Holloway will have greater adherence to the plan.  Brenda Holloway has agreed to follow up with our clinic in 2 to 3 weeks. She was informed of the importance of frequent follow up visits to maximize her success with intensive lifestyle modifications for her multiple health conditions.  ALLERGIES: Allergies  Allergen Reactions  . Prozac [Fluoxetine Hcl] Shortness Of Breath    MEDICATIONS: Current Outpatient Medications on File Prior to Visit  Medication Sig Dispense Refill  . ALPRAZolam (XANAX) 0.5 MG tablet Take 0.5 mg by mouth at bedtime as needed for anxiety.    . chlorthalidone (HYGROTON) 25 MG tablet Take 25 mg by mouth daily.    Marland Kitchen. loratadine (CLARITIN) 10 MG tablet Take 10 mg by mouth daily.     No current facility-administered medications on file prior to visit.     PAST MEDICAL HISTORY: Past Medical History:  Diagnosis Date  . Anxiety   . Cellulitis and abscess of unspecified site   . Chest pain   . Hip pain   . Insomnia   . Respiratory infection   . Unspecified otitis media   . Unspecified sinusitis (chronic)   . UTI (lower urinary tract infection)     PAST SURGICAL HISTORY: Past Surgical History:  Procedure Laterality Date  . CESAREAN  SECTION    . LIPOSUCTION    . TUBAL LIGATION      SOCIAL HISTORY: Social History   Tobacco Use  . Smoking status: Never Smoker  . Smokeless tobacco: Never Used  Substance Use Topics  . Alcohol use: Not on file  . Drug use: Not on file    FAMILY HISTORY: Family History  Problem Relation Age of Onset  . High blood pressure Mother   . Heart disease Mother   . Stroke Mother   . Anxiety disorder Mother   . Cancer Father     ROS: Review of Systems  Constitutional: Positive for weight loss.  Cardiovascular: Negative for chest pain.  Gastrointestinal: Negative for nausea and vomiting.  Genitourinary: Negative for frequency.  Musculoskeletal:       Negative muscle weakness  Endo/Heme/Allergies: Negative for polydipsia.       Negative polyphagia    PHYSICAL EXAM: Blood pressure (!) 154/96, pulse 69, temperature 98 F (36.7 C), height 5\' 2"  (1.575 m), weight 193 lb (87.5 kg), last menstrual period 01/29/2009, SpO2 96 %. Body mass index is 35.3 kg/m. Physical Exam Vitals signs reviewed.  Constitutional:      Appearance: Normal appearance. She is obese.  Cardiovascular:     Rate and Rhythm: Normal rate.     Pulses: Normal pulses.  Pulmonary:     Effort: Pulmonary effort is normal.     Breath sounds: Normal breath sounds.  Musculoskeletal: Normal range of motion.  Skin:    General: Skin is warm and dry.  Neurological:     Mental Status: She is alert and oriented to person, place, and time.  Psychiatric:        Mood and Affect: Mood normal.        Behavior: Behavior normal.     RECENT LABS AND TESTS: BMET    Component Value Date/Time   NA 138 11/13/2018 0832   K 3.1 (L) 11/13/2018 0832   CL 95 (L) 11/13/2018 0832   CO2 26 11/13/2018 0832   GLUCOSE 99 11/13/2018 0832   BUN 11 11/13/2018 0832   CREATININE 0.81 11/13/2018 0832   CALCIUM 10.1 11/13/2018 0832   GFRNONAA 80 11/13/2018 0832   GFRAA 92 11/13/2018 0832   Lab Results  Component Value Date    HGBA1C 5.3 11/13/2018   Lab Results  Component Value Date   INSULIN 13.1 11/13/2018   CBC No results found for: WBC, RBC, HGB, HCT, PLT, MCV, MCH, MCHC, RDW, LYMPHSABS, MONOABS, EOSABS, BASOSABS Iron/TIBC/Ferritin/ %Sat No results found for: IRON, TIBC, FERRITIN, IRONPCTSAT Lipid Panel     Component Value Date/Time   CHOL 250 (H) 11/13/2018 0832   TRIG 229 (H) 11/13/2018 0832   HDL 81 11/13/2018 0832   LDLCALC 129 (H) 11/13/2018 0832   Hepatic Function Panel     Component Value Date/Time   PROT 7.1 11/13/2018 0832   ALBUMIN 4.8 11/13/2018 0832   AST 36 11/13/2018 0832   ALT 43 (H) 11/13/2018 0832   ALKPHOS 78 11/13/2018 0832   BILITOT 0.4 11/13/2018 0832      Component Value Date/Time   TSH 1.250 11/13/2018 0832      OBESITY BEHAVIORAL INTERVENTION VISIT  Today's visit was # 2   Starting weight: 201 lbs Starting date: 11/13/2018 Today's weight : 193 lbs  Today's date: 11/27/2018 Total lbs lost to date: 8    ASK: We discussed the diagnosis of obesity with Mauri Brooklyn today and Hillari agreed to give [name] permission to discuss obesity behavioral modification therapy today.  ASSESS: Charisse has the diagnosis of obesity and her BMI today is 29.29 Mandi is in the action stage of change   ADVISE: Kaiya was educated on the multiple health risks of obesity as well as the benefit of weight loss to improve her health. She was advised of the need for long term treatment and the importance of lifestyle modifications to improve her current health and to decrease her risk of future health problems.  AGREE: Multiple dietary modification options and treatment options were discussed and  Sharada agreed to follow the recommendations documented in the above note.  ARRANGE: Karalynn was educated on the importance of frequent visits to treat obesity as outlined per CMS and USPSTF guidelines and agreed to schedule her next follow up appointment today.  Wilhemena Durie, am acting  as transcriptionist for [employer], DO  I have reviewed the above documentation for accuracy and completeness, and I agree with the above. -Jearld Lesch, DO

## 2018-12-23 ENCOUNTER — Encounter (INDEPENDENT_AMBULATORY_CARE_PROVIDER_SITE_OTHER): Payer: Self-pay | Admitting: Bariatrics

## 2018-12-23 ENCOUNTER — Ambulatory Visit (INDEPENDENT_AMBULATORY_CARE_PROVIDER_SITE_OTHER): Payer: BC Managed Care – PPO | Admitting: Bariatrics

## 2018-12-23 ENCOUNTER — Other Ambulatory Visit: Payer: Self-pay

## 2018-12-23 VITALS — BP 114/82 | HR 76 | Temp 94.8°F | Ht 62.0 in | Wt 191.0 lb

## 2018-12-23 DIAGNOSIS — I10 Essential (primary) hypertension: Secondary | ICD-10-CM

## 2018-12-23 DIAGNOSIS — E559 Vitamin D deficiency, unspecified: Secondary | ICD-10-CM

## 2018-12-23 DIAGNOSIS — Z6835 Body mass index (BMI) 35.0-35.9, adult: Secondary | ICD-10-CM | POA: Diagnosis not present

## 2018-12-23 MED ORDER — LISINOPRIL 10 MG PO TABS: 10.0000 mg | ORAL_TABLET | Freq: Every day | ORAL | 0 refills | Status: DC

## 2018-12-23 NOTE — Progress Notes (Signed)
Office: [phone]  /  Fax: [phone]   HPI:   Chief Complaint: OBESITY Brenda Holloway is here to discuss her progress with her obesity treatment plan. She is on the Category 3 plan with breakfast options and is following her eating plan approximately 50% of the time. She states she is exercising 0 minutes 0 times per week. Brenda Holloway is down 2 lbs and doing well overall. 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 Her weight is 191 lb (86.6 kg) today and has had a weight loss of 2 pounds over a period of 4 weeks since her last visit. She has lost 10 lbs since starting treatment with us.  Hypertension Brenda Holloway is a 58 y.o. female with hypertension, which is controlled. She is taking chlorthalidone. She had elevated blood pressure last visit and lisinopril was added.  Brenda BalDiana C Jaimes denies chest pain or shortness of breath on exertion. She is working weight loss to help control her blood pressure with the goal of decreasing her risk of heart attack and stroke. Brenda Holloway's blood pressure is well controlled at 114/82 today.   Vitamin D deficiency Brenda Holloway has a diagnosis of Vitamin D deficiency. Last Vitamin D 27.4 on 11/13/2018. She is currently taking Vit D and denies nausea, vomiting or muscle weakness.  ASSESSMENT AND PLAN:  Essential hypertension - Plan: lisinopril (ZESTRIL) 10 MG tablet  Vitamin D deficiency  Class 2 severe obesity with serious comorbidity and body mass index (BMI) of 35.0 to 35.9 in adult, unspecified obesity type (HCC)  PLAN:  Hypertension We discussed sodium restriction, working on healthy weight loss, and a regular exercise program as the means to achieve improved blood pressure control. Brenda Holloway agreed with this plan and agreed to follow up as directed. We will continue to monitor her blood pressure as well as her progress with the above lifestyle modifications. She will continue her medications as prescribed and was given a  prescription for lisinopril 10 mg 1 PO daily #30 with 0 refills. She agrees to follow-up with our clinic in 2-3 weeks. She will watch for signs of hypotension as she continues her lifestyle modifications.  Vitamin D Deficiency Brenda Holloway was informed that low Vitamin D levels contributes to fatigue and are associated with obesity, breast, and colon cancer. She agrees to continue taking Vit D and will follow-up for routine testing of Vitamin D, at least 2-3 times per year. She was informed of the risk of over-replacement of Vitamin D and agrees to not increase her dose unless she discusses this with us first. Brenda Holloway agrees to follow-up with our clinic in 2-3 weeks.  I spent > than 50% of the 20 minute visit on counseling as documented in the note.  Obesity Brenda Holloway is currently in the action stage of change. As such, her goal is to continue with weight loss efforts. She has agreed to follow the Category 3 plan with breakfast options. Brenda Holloway will work on meal planning, intentional eating, and increasing her water intake. Brenda Holloway has been instructed to work up to a goal of 150 minutes of combined cardio and strengthening exercise per week for weight loss and overall health benefits. We discussed the following Behavioral Modification Strategies today: increasing lean protein intake, decreasing simple carbohydrates, increasing vegetables, increase H20 intake, decrease eating out, no skipping meals, work on meal planning and easy cooking plans, keeping healthy foods in the home, travel eating strategies, holiday eating strategies, and celebration eating strategies.  Brenda Holloway has  agreed to follow-up with our clinic in 2-3 weeks. She was informed of the importance of frequent follow-up visits to maximize her success with intensive lifestyle modifications for her multiple health conditions.  ALLERGIES: Allergies  Allergen Reactions   Prozac [Fluoxetine Hcl] Shortness Of Breath    MEDICATIONS: Current Outpatient  Medications on File Prior to Visit  Medication Sig Dispense Refill   ALPRAZolam (XANAX) 0.5 MG tablet Take 0.5 mg by mouth at bedtime as needed for anxiety.     chlorthalidone (HYGROTON) 25 MG tablet Take 25 mg by mouth daily.     loratadine (CLARITIN) 10 MG tablet Take 10 mg by mouth daily.     Vitamin D, Ergocalciferol, (DRISDOL) 1.25 MG (50000 UT) CAPS capsule Take 1 capsule (50,000 Units total) by mouth every 7 (seven) days. 4 capsule 0   No current facility-administered medications on file prior to visit.     PAST MEDICAL HISTORY: Past Medical History:  Diagnosis Date   Anxiety    Cellulitis and abscess of unspecified site    Chest pain    Hip pain    Insomnia    Respiratory infection    Unspecified otitis media    Unspecified sinusitis (chronic)    UTI (lower urinary tract infection)     PAST SURGICAL HISTORY: Past Surgical History:  Procedure Laterality Date   CESAREAN SECTION     LIPOSUCTION     TUBAL LIGATION      SOCIAL HISTORY: Social History   Tobacco Use   Smoking status: Never Smoker   Smokeless tobacco: Never Used  Substance Use Topics   Alcohol use: Not on file   Drug use: Not on file    FAMILY HISTORY: Family History  Problem Relation Age of Onset   High blood pressure Mother    Heart disease Mother    Stroke Mother    Anxiety disorder Mother    Cancer Father    ROS: Review of Systems  Respiratory: Negative for shortness of breath.   Cardiovascular: Negative for chest pain.  Gastrointestinal: Negative for nausea and vomiting.  Musculoskeletal:       Negative for muscle weakness.   PHYSICAL EXAM: Blood pressure 114/82, pulse 76, temperature (!) 94.8 F (34.9 C), height 5\' 2"  (1.575 m), weight 191 lb (86.6 kg), last menstrual period 01/29/2009, SpO2 99 %. Body mass index is 34.93 kg/m. Physical Exam Vitals signs reviewed.  Constitutional:      Appearance: Normal appearance. She is obese.  Cardiovascular:      Rate and Rhythm: Normal rate.     Pulses: Normal pulses.  Pulmonary:     Effort: Pulmonary effort is normal.     Breath sounds: Normal breath sounds.  Musculoskeletal: Normal range of motion.  Skin:    General: Skin is warm and dry.  Neurological:     Mental Status: She is alert and oriented to person, place, and time.  Psychiatric:        Behavior: Behavior normal.   RECENT LABS AND TESTS: BMET    Component Value Date/Time   NA 138 11/13/2018 0832   K 3.1 (L) 11/13/2018 0832   CL 95 (L) 11/13/2018 0832   CO2 26 11/13/2018 0832   GLUCOSE 99 11/13/2018 0832   BUN 11 11/13/2018 0832   CREATININE 0.81 11/13/2018 0832   CALCIUM 10.1 11/13/2018 0832   GFRNONAA 80 11/13/2018 0832   GFRAA 92 11/13/2018 0832   Lab Results  Component Value Date   HGBA1C 5.3 11/13/2018  Lab Results  Component Value Date   INSULIN 13.1 11/13/2018   CBC No results found for: WBC, RBC, HGB, HCT, PLT, MCV, MCH, MCHC, RDW, LYMPHSABS, MONOABS, EOSABS, BASOSABS Iron/TIBC/Ferritin/ %Sat No results found for: IRON, TIBC, FERRITIN, IRONPCTSAT Lipid Panel     Component Value Date/Time   CHOL 250 (H) 11/13/2018 0832   TRIG 229 (H) 11/13/2018 0832   HDL 81 11/13/2018 0832   LDLCALC 129 (H) 11/13/2018 0832   Hepatic Function Panel     Component Value Date/Time   PROT 7.1 11/13/2018 0832   ALBUMIN 4.8 11/13/2018 0832   AST 36 11/13/2018 0832   ALT 43 (H) 11/13/2018 0832   ALKPHOS 78 11/13/2018 0832   BILITOT 0.4 11/13/2018 0832      Component Value Date/Time   TSH 1.250 11/13/2018 0832   Results for MALAIYA, [id number] (MRN [id number]) as of 12/23/2018 12:44  Ref. Range 11/13/2018 08:32  Vitamin D, 25-Hydroxy Latest Ref Range: 30.0 - 100.0 ng/mL 27.4 (L)   OBESITY BEHAVIORAL INTERVENTION VISIT  Today's visit was #3   Starting weight: 201 lbs Starting date: 11/13/2018 Today's weight: 191 lbs  Today's date: 12/23/2018 Total lbs lost to date: 10     12/23/2018  Height 5\' 2"  (1.575 m)    Weight 191 lb (86.6 kg)  BMI (Calculated) 34.93  BLOOD PRESSURE - SYSTOLIC 355  BLOOD PRESSURE - DIASTOLIC 82   Body Fat % 97.4 %  Total Body Water (lbs) 69.2 lbs   ASK: We discussed the diagnosis of obesity with Mauri Brooklyn today and Meliya agreed to give [name] permission to discuss obesity behavioral modification therapy today.  ASSESS: Debralee has the diagnosis of obesity and her BMI today is 35.0. Cher is in the action stage of change.   ADVISE: Suly was educated on the multiple health risks of obesity as well as the benefit of weight loss to improve her health. She was advised of the need for long term treatment and the importance of lifestyle modifications to improve her current health and to decrease her risk of future health problems.  AGREE: Multiple dietary modification options and treatment options were discussed and  Joyanna agreed to follow the recommendations documented in the above note.  ARRANGE: Cordie was educated on the importance of frequent visits to treat obesity as outlined per CMS and USPSTF guidelines and agreed to schedule her next follow up appointment today.  Migdalia Dk, am acting as Location manager for [employer], DO  I have reviewed the above documentation for accuracy and completeness, and I agree with the above. -Jearld Lesch, DO

## 2019-01-12 ENCOUNTER — Ambulatory Visit (INDEPENDENT_AMBULATORY_CARE_PROVIDER_SITE_OTHER): Payer: BC Managed Care – PPO | Admitting: Bariatrics

## 2019-01-12 ENCOUNTER — Other Ambulatory Visit: Payer: Self-pay

## 2019-01-12 ENCOUNTER — Encounter (INDEPENDENT_AMBULATORY_CARE_PROVIDER_SITE_OTHER): Payer: Self-pay | Admitting: Bariatrics

## 2019-01-12 VITALS — BP 116/80 | HR 71 | Temp 98.0°F | Ht 62.0 in | Wt 191.0 lb

## 2019-01-12 DIAGNOSIS — Z9189 Other specified personal risk factors, not elsewhere classified: Secondary | ICD-10-CM

## 2019-01-12 DIAGNOSIS — Z6835 Body mass index (BMI) 35.0-35.9, adult: Secondary | ICD-10-CM

## 2019-01-12 DIAGNOSIS — E559 Vitamin D deficiency, unspecified: Secondary | ICD-10-CM

## 2019-01-12 DIAGNOSIS — E66812 Obesity, class 2: Secondary | ICD-10-CM

## 2019-01-12 DIAGNOSIS — E8881 Metabolic syndrome: Secondary | ICD-10-CM | POA: Diagnosis not present

## 2019-01-12 DIAGNOSIS — I10 Essential (primary) hypertension: Secondary | ICD-10-CM

## 2019-01-12 DIAGNOSIS — E88819 Insulin resistance, unspecified: Secondary | ICD-10-CM

## 2019-01-12 MED ORDER — METFORMIN HCL 500 MG PO TABS: 500.0000 mg | ORAL_TABLET | Freq: Every day | ORAL | 0 refills | Status: DC

## 2019-01-12 MED ORDER — LISINOPRIL 10 MG PO TABS: 10.0000 mg | ORAL_TABLET | Freq: Every day | ORAL | 0 refills | Status: DC

## 2019-01-12 NOTE — Progress Notes (Signed)
Office: [phone]  /  Fax: [phone]   HPI:  Chief Complaint: OBESITY Brenda Holloway is here to discuss her progress with her obesity treatment plan. She is on the Category 3 plan with breakfast options and states she is following her eating plan approximately 50% of the time. She states she is exercising 0 minutes 0 times per week.  Brenda Holloway's weight remains the same. She states she ate more fat and carbs during the holiday. She reports doing well with her water intake.  Today's visit was #4 Starting weight: 201 lbs Starting date: 11/13/2018 Today's weight: 191 lbs Today's date: 01/12/2019 Total lbs lost to date: 10  Total lbs lost since last in-office visit: 0  Hypertension (Stable) Brenda Holloway has a diagnosis of hypertension, which is stable. She is taking lisinopril and chlorthalidone.  Vitamin D deficiency Brenda Holloway has a diagnosis of Vitamin D deficiency and is taking Vitamin D. She denies nausea, vomiting, or muscle weakness.  At risk for osteopenia and osteoporosis Brenda Holloway is at higher risk of osteopenia and osteoporosis due to Vitamin D deficiency.   Insulin Resistance Brenda Holloway has a diagnosis of insulin resistance and reports increased hunger in the afternoon. Last insulin was 13.1 on 11/13/2018 with an A1c of 5.3.  ASSESSMENT AND PLAN:  Essential hypertension - Plan: lisinopril (ZESTRIL) 10 MG tablet  Vitamin D deficiency  Insulin resistance - Plan: metFORMIN (GLUCOPHAGE) 500 MG tablet  At risk for osteoporosis  Class 2 severe obesity with serious comorbidity and body mass index (BMI) of 35.0 to 35.9 in adult, unspecified obesity type (HCC)  PLAN:  Hypertension Brenda Holloway is working on healthy weight loss and exercise to improve blood pressure control. She was given a prescription for lisinopril 10 mg 1 daily #30 with 0 refills and agrees to follow-up with our clinic in 3 weeks. We will watch for signs of hypotension as she continues her lifestyle modifications.  Vitamin D  Deficiency Brenda Holloway was informed that low Vitamin D levels contributes to fatigue and are associated with obesity, breast, and colon cancer. She agrees to continue Vit D and will follow-up for routine testing of Vitamin D, at least 2-3 times per year. She was informed of the risk of over-replacement of Vitamin D and agrees to not increase her dose unless she discusses this with [name] first. [name] agrees to follow-up with our clinic in 3 weeks.  At risk for osteopenia and osteoporosis Brenda Holloway was given extended  (15 minutes) osteoporosis prevention counseling today. Brenda Holloway is at risk for osteopenia and osteoporosis due to her Vitamin D deficiency. She was encouraged to take her Vitamin D and follow her higher calcium diet and increase strengthening exercise to help strengthen her bones and decrease her risk of osteopenia and osteoporosis.  Insulin Resistance Brenda Holloway will continue to work on weight loss, exercise, and decreasing simple carbohydrates to help decrease the risk of diabetes. Brenda Holloway was given a prescription for metformin 500 mg PO with lunch #30 with 0 refills and agrees to follow-up with our clinic in 3 weeks. She will increase fiber and raw vegetables.  Obesity Brenda Holloway is currently in the action stage of change. As such, her goal is to continue with weight loss efforts. She has agreed to follow the Category 3 plan. Brenda Holloway will work on meal planning, continue to increase her water intake, increase protein, and decrease carbohydrates. Brenda Holloway has been instructed to work up to a goal of 150 minutes of combined cardio and strengthening exercise per week for weight loss and overall health benefits.  We discussed the following Behavioral Modification Strategies today: increasing lean protein intake, decreasing simple carbohydrates, increasing vegetables, increase H20 intake, decrease eating out, no skipping meals, work on meal planning and easy cooking plans, keeping healthy foods in the home, and planning for  success.  Brenda Holloway has agreed to follow-up with our clinic in 3 weeks. She was informed of the importance of frequent follow-up visits to maximize her success with intensive lifestyle modifications for her multiple health conditions.  ALLERGIES: Allergies  Allergen Reactions  . Prozac [Fluoxetine Hcl] Shortness Of Breath    MEDICATIONS: Current Outpatient Medications on File Prior to Visit  Medication Sig Dispense Refill  . ALPRAZolam (XANAX) 0.5 MG tablet Take 0.5 mg by mouth at bedtime as needed for anxiety.    . chlorthalidone (HYGROTON) 25 MG tablet Take 25 mg by mouth daily.    Marland Kitchen loratadine (CLARITIN) 10 MG tablet Take 10 mg by mouth daily.    . Vitamin D, Ergocalciferol, (DRISDOL) 1.25 MG (50000 UT) CAPS capsule Take 1 capsule (50,000 Units total) by mouth every 7 (seven) days. 4 capsule 0   No current facility-administered medications on file prior to visit.    PAST MEDICAL HISTORY: Past Medical History:  Diagnosis Date  . Anxiety   . Cellulitis and abscess of unspecified site   . Chest pain   . Hip pain   . Insomnia   . Respiratory infection   . Unspecified otitis media   . Unspecified sinusitis (chronic)   . UTI (lower urinary tract infection)     PAST SURGICAL HISTORY: Past Surgical History:  Procedure Laterality Date  . CESAREAN SECTION    . LIPOSUCTION    . TUBAL LIGATION      SOCIAL HISTORY: Social History   Tobacco Use  . Smoking status: Never Smoker  . Smokeless tobacco: Never Used  Substance Use Topics  . Alcohol use: Not on file  . Drug use: Not on file    FAMILY HISTORY: Family History  Problem Relation Age of Onset  . High blood pressure Mother   . Heart disease Mother   . Stroke Mother   . Anxiety disorder Mother   . Cancer Father    ROS: Review of Systems  Gastrointestinal: Negative for nausea and vomiting.  Musculoskeletal:       Negative for muscle weakness.   PHYSICAL EXAM: Blood pressure 116/80, pulse 71, temperature 98 F  (36.7 C), height 5\' 2"  (1.575 m), weight 191 lb (86.6 kg), last menstrual period 01/29/2009, SpO2 99 %. Body mass index is 34.93 kg/m. Physical Exam Vitals reviewed.  Constitutional:      Appearance: Normal appearance. She is obese.  Cardiovascular:     Rate and Rhythm: Normal rate.     Pulses: Normal pulses.  Pulmonary:     Effort: Pulmonary effort is normal.     Breath sounds: Normal breath sounds.  Musculoskeletal:        General: Normal range of motion.  Skin:    General: Skin is warm and dry.  Neurological:     Mental Status: She is alert and oriented to person, place, and time.  Psychiatric:        Behavior: Behavior normal.   RECENT LABS AND TESTS: BMET    Component Value Date/Time   NA 138 11/13/2018 0832   K 3.1 (L) 11/13/2018 0832   CL 95 (L) 11/13/2018 0832   CO2 26 11/13/2018 0832   GLUCOSE 99 11/13/2018 0832   BUN 11 11/13/2018 2542  CREATININE 0.81 11/13/2018 0832   CALCIUM 10.1 11/13/2018 0832   GFRNONAA 80 11/13/2018 0832   GFRAA 92 11/13/2018 0832   Lab Results  Component Value Date   HGBA1C 5.3 11/13/2018   Lab Results  Component Value Date   INSULIN 13.1 11/13/2018   CBC No results found for: WBC, RBC, HGB, HCT, PLT, MCV, MCH, MCHC, RDW, LYMPHSABS, MONOABS, EOSABS, BASOSABS Iron/TIBC/Ferritin/ %Sat No results found for: IRON, TIBC, FERRITIN, IRONPCTSAT Lipid Panel     Component Value Date/Time   CHOL 250 (H) 11/13/2018 0832   TRIG 229 (H) 11/13/2018 0832   HDL 81 11/13/2018 0832   LDLCALC 129 (H) 11/13/2018 0832   Hepatic Function Panel     Component Value Date/Time   PROT 7.1 11/13/2018 0832   ALBUMIN 4.8 11/13/2018 0832   AST 36 11/13/2018 0832   ALT 43 (H) 11/13/2018 0832   ALKPHOS 78 11/13/2018 0832   BILITOT 0.4 11/13/2018 0832      Component Value Date/Time   TSH 1.250 11/13/2018 0832    OBESITY BEHAVIORAL INTERVENTION VISIT DOCUMENTATION FOR INSURANCE (~15 minutes)  I, Marianna Paymentenise Haag, am acting as Energy managertranscriptionist for  [employer] Yukio Bisping, DO  I have reviewed the above documentation for accuracy and completeness, and I agree with the above. Corinna Capra-Symphony Demuro, DO

## 2019-01-13 ENCOUNTER — Encounter (INDEPENDENT_AMBULATORY_CARE_PROVIDER_SITE_OTHER): Payer: Self-pay | Admitting: Bariatrics

## 2019-01-20 ENCOUNTER — Other Ambulatory Visit (INDEPENDENT_AMBULATORY_CARE_PROVIDER_SITE_OTHER): Payer: Self-pay | Admitting: Bariatrics

## 2019-01-20 DIAGNOSIS — I10 Essential (primary) hypertension: Secondary | ICD-10-CM

## 2019-01-20 DIAGNOSIS — E8881 Metabolic syndrome: Secondary | ICD-10-CM

## 2019-02-03 ENCOUNTER — Other Ambulatory Visit (INDEPENDENT_AMBULATORY_CARE_PROVIDER_SITE_OTHER): Payer: Self-pay | Admitting: Bariatrics

## 2019-02-03 DIAGNOSIS — E8881 Metabolic syndrome: Secondary | ICD-10-CM

## 2019-02-05 DIAGNOSIS — Z20828 Contact with and (suspected) exposure to other viral communicable diseases: Secondary | ICD-10-CM | POA: Diagnosis not present

## 2019-02-09 ENCOUNTER — Other Ambulatory Visit: Payer: Self-pay

## 2019-02-09 ENCOUNTER — Encounter (INDEPENDENT_AMBULATORY_CARE_PROVIDER_SITE_OTHER): Payer: Self-pay | Admitting: Bariatrics

## 2019-02-09 ENCOUNTER — Ambulatory Visit (INDEPENDENT_AMBULATORY_CARE_PROVIDER_SITE_OTHER): Payer: BC Managed Care – PPO | Admitting: Bariatrics

## 2019-02-09 VITALS — BP 123/74 | HR 74 | Temp 98.4°F | Ht 62.0 in | Wt 191.0 lb

## 2019-02-09 DIAGNOSIS — I10 Essential (primary) hypertension: Secondary | ICD-10-CM

## 2019-02-09 DIAGNOSIS — Z6834 Body mass index (BMI) 34.0-34.9, adult: Secondary | ICD-10-CM | POA: Diagnosis not present

## 2019-02-09 DIAGNOSIS — E8881 Metabolic syndrome: Secondary | ICD-10-CM

## 2019-02-09 DIAGNOSIS — E669 Obesity, unspecified: Secondary | ICD-10-CM

## 2019-02-09 MED ORDER — LISINOPRIL 10 MG PO TABS: 10.0000 mg | ORAL_TABLET | Freq: Every day | ORAL | 0 refills | Status: DC

## 2019-02-09 MED ORDER — METFORMIN HCL 500 MG PO TABS: 500.0000 mg | ORAL_TABLET | Freq: Every day | ORAL | 0 refills | Status: DC

## 2019-02-11 NOTE — Progress Notes (Signed)
Chief Complaint:   OBESITY Brenda Holloway is here to discuss her progress with her obesity treatment plan along with follow-up of her obesity related diagnoses. Brenda Holloway is on the Category 3 Plan and states she is following her eating plan approximately 70% of the time. Brenda Holloway states she is walking 2 miles 7 times per week.  Today's visit was #: 5 Starting weight: 201 lbs Starting date: 10/15/220 Today's weight: 191 lbs Today's date: 02/09/2019 Total lbs lost to date: 10 Total lbs lost since last in-office visit: 0  Interim History: Sun's weight has remained the same per the bioimpedance scale. She is up 2 lbs of water. She has had some salt in her food.  Subjective:   Essential hypertension. Brenda Holloway is taking chlorthalidone and lisinopril. This is well controlled.  BP Readings from Last 3 Encounters:  02/09/19 123/74  01/12/19 116/80  12/23/18 114/82   Insulin resistance. Brenda Holloway has a diagnosis of insulin resistance based on her elevated fasting insulin level >5. She continues to work on diet and exercise to decrease her risk of diabetes. She is taking metformin. Last A1c 5.3 on 11/13/2018.  Lab Results  Component Value Date   INSULIN 13.1 11/13/2018   Assessment/Plan:   Essential hypertension. [name] is working on healthy weight loss and exercise to improve blood pressure control. We will watch for signs of hypotension as she continues her lifestyle modifications. lisinopril (ZESTRIL) 10 MG tablet 1 PO daily 30 with 0 refills was prescribed.  Insulin resistance. Brenda Holloway will continue to work on weight loss, exercise, and decreasing simple carbohydrates to help decrease the risk of diabetes. Brenda Holloway agreed to follow-up with [name] as directed to closely monitor her progress. metFORMIN (GLUCOPHAGE) 500 MG tablet 1 PO daily with breakfast #30 with 0 refills was prescribed.  Class 1 obesity with serious comorbidity and body mass index (BMI) of 34.0 to 34.9 in adult, unspecified obesity  type.  Brenda Holloway is currently in the action stage of change. As such, her goal is to continue with weight loss efforts. She has agreed to the Category 3 Plan.   She will work on meal planning, intentional eating, decrease eating out, canned food, and will not add salt. She will increase her water intake.  We discussed the following exercise goals today: Brenda Holloway will continue to walk for 2 miles 7 times per week.   We discussed the following behavioral modification strategies today: increasing lean protein intake, decreasing simple carbohydrates, increasing vegetables, increasing water intake, decreasing eating out, no skipping meals, meal planning and cooking strategies, keeping healthy foods in the home, dealing with family or coworker sabotage, travel eating strategies, holiday eating strategies  and celebration eating strategies.  Brenda Holloway has agreed to follow-up with our clinic in 2 weeks. She was informed of the importance of frequent follow-up visits to maximize her success with intensive lifestyle modifications for her multiple health conditions.   Objective:   Blood pressure 123/74, pulse 74, temperature 98.4 F (36.9 C), height 5\' 2"  (1.575 m), weight 191 lb (86.6 kg), last menstrual period 01/29/2009, SpO2 99 %. Body mass index is 34.93 kg/m.  General: Cooperative, alert, well developed, in no acute distress. HEENT: Conjunctivae and lids unremarkable. Neck: No thyromegaly.  Cardiovascular: Regular rhythm.  Lungs: Normal work of breathing. Extremities: No edema.  Neurologic: No focal deficits.   Lab Results  Component Value Date   CREATININE 0.81 11/13/2018   BUN 11 11/13/2018   NA 138 11/13/2018   K 3.1 (  L) 11/13/2018   CL 95 (L) 11/13/2018   CO2 26 11/13/2018   Lab Results  Component Value Date   ALT 43 (H) 11/13/2018   AST 36 11/13/2018   ALKPHOS 78 11/13/2018   BILITOT 0.4 11/13/2018   Lab Results  Component Value Date   HGBA1C 5.3 11/13/2018   Lab  Results  Component Value Date   INSULIN 13.1 11/13/2018   Lab Results  Component Value Date   TSH 1.250 11/13/2018   Lab Results  Component Value Date   CHOL 250 (H) 11/13/2018   HDL 81 11/13/2018   LDLCALC 129 (H) 11/13/2018   TRIG 229 (H) 11/13/2018   No results found for: WBC, HGB, HCT, MCV, PLT No results found for: IRON, TIBC, FERRITIN  Attestation Statements:   Reviewed by clinician on day of visit: allergies, medications, problem list, medical history, surgical history, family history, social history, and previous encounter notes.  Migdalia Dk, am acting as Location manager for [employer], DO  I have reviewed the above documentation for accuracy and completeness, and I agree with the above. Jearld Lesch, DO

## 2019-02-12 ENCOUNTER — Encounter (INDEPENDENT_AMBULATORY_CARE_PROVIDER_SITE_OTHER): Payer: Self-pay | Admitting: Bariatrics

## 2019-02-12 ENCOUNTER — Other Ambulatory Visit (INDEPENDENT_AMBULATORY_CARE_PROVIDER_SITE_OTHER): Payer: Self-pay | Admitting: Bariatrics

## 2019-02-12 DIAGNOSIS — I10 Essential (primary) hypertension: Secondary | ICD-10-CM

## 2019-02-23 DIAGNOSIS — M9906 Segmental and somatic dysfunction of lower extremity: Secondary | ICD-10-CM | POA: Diagnosis not present

## 2019-02-23 DIAGNOSIS — M25552 Pain in left hip: Secondary | ICD-10-CM | POA: Diagnosis not present

## 2019-02-23 DIAGNOSIS — M7060 Trochanteric bursitis, unspecified hip: Secondary | ICD-10-CM | POA: Diagnosis not present

## 2019-02-23 DIAGNOSIS — M791 Myalgia, unspecified site: Secondary | ICD-10-CM | POA: Diagnosis not present

## 2019-02-25 ENCOUNTER — Other Ambulatory Visit: Payer: Self-pay

## 2019-02-25 ENCOUNTER — Ambulatory Visit (INDEPENDENT_AMBULATORY_CARE_PROVIDER_SITE_OTHER): Payer: BC Managed Care – PPO | Admitting: Family Medicine

## 2019-02-25 ENCOUNTER — Encounter (INDEPENDENT_AMBULATORY_CARE_PROVIDER_SITE_OTHER): Payer: Self-pay | Admitting: Family Medicine

## 2019-02-25 VITALS — BP 116/72 | HR 79 | Temp 97.5°F | Ht 62.0 in | Wt 183.0 lb

## 2019-02-25 DIAGNOSIS — E669 Obesity, unspecified: Secondary | ICD-10-CM | POA: Diagnosis not present

## 2019-02-25 DIAGNOSIS — I10 Essential (primary) hypertension: Secondary | ICD-10-CM

## 2019-02-25 DIAGNOSIS — E8881 Metabolic syndrome: Secondary | ICD-10-CM

## 2019-02-25 DIAGNOSIS — Z6833 Body mass index (BMI) 33.0-33.9, adult: Secondary | ICD-10-CM

## 2019-02-25 DIAGNOSIS — Z9189 Other specified personal risk factors, not elsewhere classified: Secondary | ICD-10-CM | POA: Diagnosis not present

## 2019-02-25 MED ORDER — LISINOPRIL 10 MG PO TABS: 10.0000 mg | ORAL_TABLET | Freq: Every day | ORAL | 0 refills | Status: DC

## 2019-02-25 MED ORDER — METFORMIN HCL 500 MG PO TABS: 500.0000 mg | ORAL_TABLET | Freq: Two times a day (BID) | ORAL | 0 refills | Status: DC

## 2019-02-25 NOTE — Progress Notes (Signed)
Chief Complaint:   OBESITY Brenda Holloway is here to discuss her progress with her obesity treatment plan along with follow-up of her obesity related diagnoses. Brenda Holloway is on the Category 3 Plan and states she is following her eating plan approximately 75% of the time. Brenda Holloway states she is walking 60 minutes 7 times per week.  Today's visit was #: 6 Starting weight: 201 lbs Starting date: 11/13/2018 Today's weight: 183 lbs Today's date: 02/25/2019 Total lbs lost to date: 18 Total lbs lost since last in-office visit: 8  Interim History: Brenda Holloway loves the Category 3 plan. She has a cheat meal one time per week but then gets back on the plan. She is eating all of the protein on the plan.  Subjective:   Essential hypertension  Brenda Holloway's blood pressure is well controlled on Chlorthalidone and Lisinopril. She denies chest pain or shortness of breath.  BP Readings from Last 3 Encounters:  02/25/19 116/72  02/09/19 123/74  01/12/19 116/80   Lab Results  Component Value Date   CREATININE 0.81 11/13/2018   Insulin resistance Brenda Holloway has a diagnosis of insulin resistance based on her elevated fasting insulin level >5. She is on metformin at lunch. Brenda Holloway admits to polyphagia in the afternoon and evening. She continues to work on diet and exercise to decrease her risk of diabetes.  Lab Results  Component Value Date   INSULIN 13.1 11/13/2018   Lab Results  Component Value Date   HGBA1C 5.3 11/13/2018   At risk for diabetes mellitus Brenda Holloway is at higher than average risk for developing diabetes due to her obesity and insulin resistance.   Assessment/Plan:   Essential hypertension  Brenda Holloway is working on healthy weight loss and exercise to improve blood pressure control. Brenda Holloway agrees to continue Lisinopril 10 mg daily #30 with no refills. We will watch for signs of hypotension as she continues her lifestyle modifications.  Insulin resistance  Brenda Holloway will continue to work on weight loss,  exercise, and decreasing simple carbohydrates to help decrease the risk of diabetes. She agreed to increase the dose of metformin to 500 mg two times daily with meals #60 with no refills. Brenda Holloway agreed to follow-up with [name] as directed to closely monitor her progress.  At risk for diabetes mellitus Brenda Holloway was given approximately 15 minutes of diabetes education and counseling today. We discussed intensive lifestyle modifications today with an emphasis on weight loss as well as increasing exercise and decreasing simple carbohydrates in her diet. We also reviewed insulin resistance.  Repetitive spaced learning was employed today to elicit superior memory formation and behavioral change.  Class 1 obesity with serious comorbidity and body mass index (BMI) of 33.0 to 33.9 in adult, unspecified obesity type Brenda Holloway is currently in the action stage of change. As such, her goal is to continue with weight loss efforts. She has agreed to the Category 3 Plan.   Exercise goals: Brenda Holloway will continue walking for 60 minutes, 7 times per week.  Behavioral modification strategies: decreasing simple carbohydrates and planning for success.  Brenda Holloway has agreed to follow-up with our clinic in 4 weeks (by patient request). She was informed of the importance of frequent follow-up visits to maximize her success with intensive lifestyle modifications for her multiple health conditions.   Objective:   Blood pressure 116/72, pulse 79, temperature (!) 97.5 F (36.4 C), temperature source Oral, height 5\' 2"  (1.575 m), weight 183 lb (83 kg), last menstrual period 01/29/2009, SpO2 100 %. Body mass index  is 33.47 kg/m.  General: Cooperative, alert, well developed, in no acute distress. HEENT: Conjunctivae and lids unremarkable. Cardiovascular: Regular rhythm.  Lungs: Normal work of breathing. Neurologic: No focal deficits.   Lab Results  Component Value Date   CREATININE 0.81 11/13/2018   BUN 11 11/13/2018   NA 138  11/13/2018   K 3.1 (L) 11/13/2018   CL 95 (L) 11/13/2018   CO2 26 11/13/2018   Lab Results  Component Value Date   ALT 43 (H) 11/13/2018   AST 36 11/13/2018   ALKPHOS 78 11/13/2018   BILITOT 0.4 11/13/2018   Lab Results  Component Value Date   HGBA1C 5.3 11/13/2018   Lab Results  Component Value Date   INSULIN 13.1 11/13/2018   Lab Results  Component Value Date   TSH 1.250 11/13/2018   Lab Results  Component Value Date   CHOL 250 (H) 11/13/2018   HDL 81 11/13/2018   LDLCALC 129 (H) 11/13/2018   TRIG 229 (H) 11/13/2018   No results found for: WBC, HGB, HCT, MCV, PLT No results found for: IRON, TIBC, FERRITIN   Ref. Range 11/13/2018 08:32  Vitamin D, 25-Hydroxy Latest Ref Range: 30.0 - 100.0 ng/mL 27.4 (L)    Attestation Statements:   Reviewed by clinician on day of visit: allergies, medications, problem list, medical history, surgical history, family history, social history, and previous encounter notes.  Cristi Loron, am acting as Energy manager for [place], FNP-C.  I have reviewed the above documentation for accuracy and completeness, and I agree with the above. -  Jamesmichael Shadd [employer], FNP-C

## 2019-02-26 ENCOUNTER — Encounter (INDEPENDENT_AMBULATORY_CARE_PROVIDER_SITE_OTHER): Payer: Self-pay | Admitting: Family Medicine

## 2019-02-26 DIAGNOSIS — E669 Obesity, unspecified: Secondary | ICD-10-CM | POA: Insufficient documentation

## 2019-02-26 DIAGNOSIS — I10 Essential (primary) hypertension: Secondary | ICD-10-CM | POA: Insufficient documentation

## 2019-03-04 ENCOUNTER — Other Ambulatory Visit (INDEPENDENT_AMBULATORY_CARE_PROVIDER_SITE_OTHER): Payer: Self-pay | Admitting: Bariatrics

## 2019-03-04 DIAGNOSIS — E8881 Metabolic syndrome: Secondary | ICD-10-CM

## 2019-03-26 ENCOUNTER — Ambulatory Visit (INDEPENDENT_AMBULATORY_CARE_PROVIDER_SITE_OTHER): Payer: BC Managed Care – PPO | Admitting: Family Medicine

## 2019-03-26 ENCOUNTER — Other Ambulatory Visit: Payer: Self-pay

## 2019-03-26 ENCOUNTER — Encounter (INDEPENDENT_AMBULATORY_CARE_PROVIDER_SITE_OTHER): Payer: Self-pay | Admitting: Family Medicine

## 2019-03-26 VITALS — BP 108/69 | HR 69 | Temp 97.5°F | Ht 62.0 in | Wt 177.0 lb

## 2019-03-26 DIAGNOSIS — I10 Essential (primary) hypertension: Secondary | ICD-10-CM

## 2019-03-26 DIAGNOSIS — E7849 Other hyperlipidemia: Secondary | ICD-10-CM | POA: Diagnosis not present

## 2019-03-26 DIAGNOSIS — E8881 Metabolic syndrome: Secondary | ICD-10-CM

## 2019-03-26 DIAGNOSIS — E559 Vitamin D deficiency, unspecified: Secondary | ICD-10-CM

## 2019-03-26 DIAGNOSIS — E669 Obesity, unspecified: Secondary | ICD-10-CM

## 2019-03-26 DIAGNOSIS — Z6832 Body mass index (BMI) 32.0-32.9, adult: Secondary | ICD-10-CM

## 2019-03-26 MED ORDER — CHLORTHALIDONE 25 MG PO TABS: 25.0000 mg | ORAL_TABLET | Freq: Every day | ORAL | 0 refills | Status: DC

## 2019-03-26 MED ORDER — LISINOPRIL 10 MG PO TABS: 10.0000 mg | ORAL_TABLET | Freq: Every day | ORAL | 0 refills | Status: DC

## 2019-03-26 NOTE — Progress Notes (Signed)
Chief Complaint:   OBESITY Brenda Holloway is here to discuss her progress with her obesity treatment plan along with follow-up of her obesity related diagnoses. Brenda Holloway is on the Category 3 Plan and states she is following her eating plan approximately 70% of the time. [name] states she is walking 60 minutes 7 times per week.  Today's visit was #: 7 Starting weight: 201 lbs Starting date: 11/13/2018 Today's weight: 177 lbs Today's date: 03/26/2019 Total lbs lost to date: 24 Total lbs lost since last in-office visit: 6  Interim History: Brenda Holloway is doing very well on the plan. She is eating all of the food on the plan. She reports that she occasionally goes over 300 on extra calories.  Subjective:   Insulin resistance  [name] has a diagnosis of insulin resistance. The increased dose of metformin is helping with polyphagia. She is on metformin two times a day. She continues to work on diet and exercise to decrease her risk of diabetes.  Lab Results  Component Value Date   INSULIN 13.1 11/13/2018   Lab Results  Component Value Date   HGBA1C 5.3 11/13/2018   Essential hypertension [name]'s blood pressure is well controlled. Her blood pressure has steadily decreased as she has lost weight. She denies dizziness or weakness.  BP Readings from Last 3 Encounters:  03/26/19 108/69  02/25/19 116/72  02/09/19 123/74   Lab Results  Component Value Date   CREATININE 0.81 11/13/2018   Vitamin D deficiency  Brenda Holloway's Vitamin D level was 27.4 on 11/13/18 and was not at goal. She is on OTC vit D only. She denies nausea, vomiting or muscle weakness.  Other hyperlipidemia Brenda Holloway has hyperlipidemia and she is not on statin. Her last LDL was elevated at 129, triglycerides were elevated at 229 and HDL was within normal limits (81). She has been trying to improve her cholesterol levels with intensive lifestyle modification including a low saturated fat diet, exercise and weight loss.   Lab  Results  Component Value Date   ALT 43 (H) 11/13/2018   AST 36 11/13/2018   ALKPHOS 78 11/13/2018   BILITOT 0.4 11/13/2018   Lab Results  Component Value Date   CHOL 250 (H) 11/13/2018   HDL 81 11/13/2018   LDLCALC 129 (H) 11/13/2018   TRIG 229 (H) 11/13/2018   Assessment/Plan:   Insulin resistance [name] will continue to work on weight loss, exercise, and decreasing simple carbohydrates to help decrease the risk of diabetes. We will check A1c, fasting glucose and insulin today. [name] will continue metformin and follow-up with [name] as directed to closely monitor her progress.  Essential hypertension  [name] is working on healthy weight loss and exercise to improve blood pressure control. [name] agrees to continue chlorthalidone (HYGROTON) 25 MG tablet once daily #30 with no refills and lisinopril (ZESTRIL) 10 MG tablet once daily #30 with no refills. We will watch for signs of hypotension as she continues her lifestyle modifications.  Vitamin D deficiency Low Vitamin D level contributes to fatigue and are associated with obesity, breast, and colon cancer. [name] will continue to take OTC Vitamin D and we will check vitamin D level today. She will follow-up for routine testing of Vitamin D, at least 2-3 times per year to avoid over-replacement.  Other hyperlipidemia Cardiovascular risk and specific lipid/LDL goals reviewed.  We discussed several lifestyle modifications today and [name] will continue to work on diet, exercise and weight loss efforts. We will check fasting lipid panel today.  Orders and follow up as documented in patient record.   Class 1 obesity with serious comorbidity and body mass index (BMI) of 32.0 to 32.9 in adult, unspecified obesity type Brenda Holloway is currently in the action stage of change. As such, her goal is to continue with weight loss efforts. She has agreed to the Category 3 Plan.   Exercise goals: Brenda Holloway will continue her current exercise regimen and she will add  resistance 3 times per week.  Behavioral modification strategies: decreasing simple carbohydrates and planning for success.  Brenda Holloway has agreed to follow-up with our clinic in 4 weeks. She was informed of the importance of frequent follow-up visits to maximize her success with intensive lifestyle modifications for her multiple health conditions.   Brenda Holloway was informed we would discuss her lab results at her next visit unless there is a critical issue that needs to be addressed sooner. Brenda Holloway agreed to keep her next visit at the agreed upon time to discuss these results.  Objective:   Blood pressure 108/69, pulse 69, temperature (!) 97.5 F (36.4 C), temperature source Oral, height 5\' 2"  (1.575 m), weight 177 lb (80.3 kg), last menstrual period 01/29/2009, SpO2 99 %. Body mass index is 32.37 kg/m.  General: Cooperative, alert, well developed, in no acute distress. HEENT: Conjunctivae and lids unremarkable. Cardiovascular: Regular rhythm.  Lungs: Normal work of breathing. Neurologic: No focal deficits.   Lab Results  Component Value Date   CREATININE 0.81 11/13/2018   BUN 11 11/13/2018   NA 138 11/13/2018   K 3.1 (L) 11/13/2018   CL 95 (L) 11/13/2018   CO2 26 11/13/2018   Lab Results  Component Value Date   ALT 43 (H) 11/13/2018   AST 36 11/13/2018   ALKPHOS 78 11/13/2018   BILITOT 0.4 11/13/2018   Lab Results  Component Value Date   HGBA1C 5.3 11/13/2018   Lab Results  Component Value Date   INSULIN 13.1 11/13/2018   Lab Results  Component Value Date   TSH 1.250 11/13/2018   Lab Results  Component Value Date   CHOL 250 (H) 11/13/2018   HDL 81 11/13/2018   LDLCALC 129 (H) 11/13/2018   TRIG 229 (H) 11/13/2018   No results found for: WBC, HGB, HCT, MCV, PLT No results found for: IRON, TIBC, FERRITIN   Ref. Range 11/13/2018 08:32  Vitamin D, 25-Hydroxy Latest Ref Range: 30.0 - 100.0 ng/mL 27.4 (L)    Attestation Statements:   Reviewed by clinician on day of  visit: allergies, medications, problem list, medical history, surgical history, family history, social history, and previous encounter notes.  11/15/2018, am acting as Cristi Loron for Energy manager, FNP-C.  I have reviewed the above documentation for accuracy and completeness, and I agree with the above. -  Manjot Hinks [place], FNP-C

## 2019-03-27 LAB — COMPREHENSIVE METABOLIC PANEL
ALT: 21 IU/L (ref 0–32)
AST: 22 IU/L (ref 0–40)
Albumin/Globulin Ratio: 2.6 — ABNORMAL HIGH (ref 1.2–2.2)
Albumin: 5 g/dL — ABNORMAL HIGH (ref 3.8–4.9)
Alkaline Phosphatase: 63 IU/L (ref 39–117)
BUN/Creatinine Ratio: 16 (ref 9–23)
BUN: 12 mg/dL (ref 6–24)
Bilirubin Total: 0.4 mg/dL (ref 0.0–1.2)
CO2: 24 mmol/L (ref 20–29)
Calcium: 10.1 mg/dL (ref 8.7–10.2)
Chloride: 96 mmol/L (ref 96–106)
Creatinine, Ser: 0.73 mg/dL (ref 0.57–1.00)
GFR calc Af Amer: 104 mL/min/{1.73_m2} (ref >59)
GFR calc non Af Amer: 90 mL/min/{1.73_m2} (ref >59)
Globulin, Total: 1.9 g/dL (ref 1.5–4.5)
Glucose: 89 mg/dL (ref 65–99)
Potassium: 4.2 mmol/L (ref 3.5–5.2)
Sodium: 139 mmol/L (ref 134–144)
Total Protein: 6.9 g/dL (ref 6.0–8.5)

## 2019-03-27 LAB — LIPID PANEL WITH LDL/HDL RATIO
Cholesterol, Total: 192 mg/dL (ref 100–199)
HDL: 75 mg/dL (ref >39)
LDL Chol Calc (NIH): 99 mg/dL (ref 0–99)
LDL/HDL Ratio: 1.3 ratio (ref 0.0–3.2)
Triglycerides: 101 mg/dL (ref 0–149)
VLDL Cholesterol Cal: 18 mg/dL (ref 5–40)

## 2019-03-27 LAB — INSULIN, RANDOM: INSULIN: 6.6 u[IU]/mL (ref 2.6–24.9)

## 2019-03-27 LAB — HEMOGLOBIN A1C
Est. average glucose Bld gHb Est-mCnc: 100 mg/dL
Hgb A1c MFr Bld: 5.1 % (ref 4.8–5.6)

## 2019-03-27 LAB — VITAMIN D 25 HYDROXY (VIT D DEFICIENCY, FRACTURES): Vit D, 25-Hydroxy: 48.2 ng/mL (ref 30.0–100.0)

## 2019-04-06 ENCOUNTER — Other Ambulatory Visit (INDEPENDENT_AMBULATORY_CARE_PROVIDER_SITE_OTHER): Payer: Self-pay | Admitting: Family Medicine

## 2019-04-06 DIAGNOSIS — Z1151 Encounter for screening for human papillomavirus (HPV): Secondary | ICD-10-CM | POA: Diagnosis not present

## 2019-04-06 DIAGNOSIS — Z01419 Encounter for gynecological examination (general) (routine) without abnormal findings: Secondary | ICD-10-CM | POA: Diagnosis not present

## 2019-04-06 DIAGNOSIS — Z6832 Body mass index (BMI) 32.0-32.9, adult: Secondary | ICD-10-CM | POA: Diagnosis not present

## 2019-04-06 DIAGNOSIS — E8881 Metabolic syndrome: Secondary | ICD-10-CM

## 2019-04-06 DIAGNOSIS — Z1231 Encounter for screening mammogram for malignant neoplasm of breast: Secondary | ICD-10-CM | POA: Diagnosis not present

## 2019-04-06 MED ORDER — METFORMIN HCL 500 MG PO TABS: 500.0000 mg | ORAL_TABLET | Freq: Two times a day (BID) | ORAL | 0 refills | Status: DC

## 2019-04-16 DIAGNOSIS — Z780 Asymptomatic menopausal state: Secondary | ICD-10-CM | POA: Diagnosis not present

## 2019-04-16 DIAGNOSIS — M85852 Other specified disorders of bone density and structure, left thigh: Secondary | ICD-10-CM | POA: Diagnosis not present

## 2019-04-27 ENCOUNTER — Ambulatory Visit (INDEPENDENT_AMBULATORY_CARE_PROVIDER_SITE_OTHER): Payer: BC Managed Care – PPO | Admitting: Family Medicine

## 2019-04-27 ENCOUNTER — Other Ambulatory Visit: Payer: Self-pay

## 2019-04-27 VITALS — BP 106/71 | HR 79 | Temp 97.4°F | Ht 62.0 in | Wt 175.0 lb

## 2019-04-27 DIAGNOSIS — Z6832 Body mass index (BMI) 32.0-32.9, adult: Secondary | ICD-10-CM

## 2019-04-27 DIAGNOSIS — E559 Vitamin D deficiency, unspecified: Secondary | ICD-10-CM | POA: Diagnosis not present

## 2019-04-27 DIAGNOSIS — Z9189 Other specified personal risk factors, not elsewhere classified: Secondary | ICD-10-CM

## 2019-04-27 DIAGNOSIS — I10 Essential (primary) hypertension: Secondary | ICD-10-CM | POA: Diagnosis not present

## 2019-04-27 DIAGNOSIS — E8881 Metabolic syndrome: Secondary | ICD-10-CM | POA: Diagnosis not present

## 2019-04-27 DIAGNOSIS — E669 Obesity, unspecified: Secondary | ICD-10-CM

## 2019-04-27 MED ORDER — METFORMIN HCL 500 MG PO TABS: 500.0000 mg | ORAL_TABLET | Freq: Two times a day (BID) | ORAL | 0 refills | Status: DC

## 2019-04-27 MED ORDER — LISINOPRIL 10 MG PO TABS: 10.0000 mg | ORAL_TABLET | Freq: Every day | ORAL | 0 refills | Status: DC

## 2019-04-27 MED ORDER — VITAMIN D 125 MCG (5000 UT) PO CAPS: 1.0000 | ORAL_CAPSULE | ORAL | 0 refills | Status: DC

## 2019-04-28 ENCOUNTER — Encounter (INDEPENDENT_AMBULATORY_CARE_PROVIDER_SITE_OTHER): Payer: Self-pay | Admitting: Family Medicine

## 2019-04-28 NOTE — Progress Notes (Addendum)
Chief Complaint:   OBESITY Brenda Holloway is here to discuss her progress with her obesity treatment plan along with follow-up of her obesity related diagnoses. Brenda Holloway is on the Category 3 Plan and states she is following her eating plan approximately 5% of the time. Brenda Holloway states she is walking for 30-60 minutes 7 times per week.  Today's visit was #: 8 Starting weight: 201 lbs Starting date: 11/13/2018 Today's weight: 175 lbs Today's date: 04/27/2019 Total lbs lost to date: 26 Total lbs lost since last in-office visit: 2  Interim History: Brenda Holloway has been off the plan due to circumstances beyond her control, but she has been making good choices. She reports she is now back on the plan.  Subjective:   1. Insulin resistance Brenda Holloway is on metformin BID. She denies polyphagia. She reports that metformin helps a great deal with hunger. Lab Results  Component Value Date   HGBA1C 5.1 03/26/2019    2. Essential hypertension Well controlled. She denies dizziness. BP Readings from Last 3 Encounters:  04/27/19 106/71  03/26/19 108/69  02/25/19 116/72    3. Vitamin D deficiency Brenda Holloway's Vit D level is nearly at goal (48.2). She is on prescription Vit D.  4. At risk for osteoporosis Brenda Holloway is at higher risk of osteopenia and osteoporosis due to Vitamin D deficiency.   Assessment/Plan:   1. Insulin resistance Brenda Holloway will continue to work on weight loss, exercise, and decreasing simple carbohydrates to help decrease the risk of diabetes. We will refill metformin for 1 month. Brenda Holloway agreed to follow-up with [name] as directed to closely monitor her progress.  - metFORMIN (GLUCOPHAGE) 500 MG tablet; Take 1 tablet (500 mg total) by mouth 2 (two) times daily with a meal.  Dispense: 60 tablet; Refill: 0  2. Essential hypertension Brenda Holloway is working on healthy weight loss and exercise to improve blood pressure control. We will watch for signs of hypotension as she continues her lifestyle modifications.  Brenda Holloway agreed to decrease chlorthalidone to 12.5 mg daily, no refill needed, and we will refill lisinopril for 1 month.  - lisinopril (ZESTRIL) 10 MG tablet; Take 1 tablet (10 mg total) by mouth daily.  Dispense: 30 tablet; Refill: 0  3. Vitamin D deficiency Low Vitamin D level contributes to fatigue and are associated with obesity, breast, and colon cancer. We will refill prescription Vitamin D for 1 month. Brenda Holloway will follow-up for routine testing of Vitamin D, at least 2-3 times per year to avoid over-replacement.  - Cholecalciferol (VITAMIN D) 125 MCG (50000 UT) CAPS; Take 1 capsule by mouth once a week.  Dispense: 4 capsule; Refill: 0  4. At risk for osteoporosis Brenda Holloway was given approximately 15 minutes of osteoporosis prevention counseling today. Brenda Holloway is at risk for osteopenia and osteoporosis due to her Vitamin D deficiency. She was encouraged to take her Vitamin D and follow her higher calcium diet and increase strengthening exercise to help strengthen her bones and decrease her risk of osteopenia and osteoporosis.  Repetitive spaced learning was employed today to elicit superior memory formation and behavioral change.  5. Class 1 obesity with serious comorbidity and body mass index (BMI) of 32.0 to 32.9 in adult, unspecified obesity type Brenda Holloway is currently in the action stage of change. As such, her goal is to continue with weight loss efforts. She has agreed to the Category 3 Plan.   Exercise goals: As is, and will add resistance exercise 2 times per week.  Behavioral modification strategies: increasing lean protein  intake, decreasing simple carbohydrates, increasing water intake and planning for success.  Brenda Holloway has agreed to follow-up with our clinic in 4 weeks. She was informed of the importance of frequent follow-up visits to maximize her success with intensive lifestyle modifications for her multiple health conditions.   Objective:   Blood pressure 106/71, pulse 79, temperature  (!) 97.4 F (36.3 C), temperature source Oral, height 5\' 2"  (1.575 m), weight 175 lb (79.4 kg), last menstrual period 01/29/2009, SpO2 100 %. Body mass index is 32.01 kg/m.  General: Cooperative, alert, well developed, in no acute distress. HEENT: Conjunctivae and lids unremarkable. Cardiovascular: Regular rhythm.  Lungs: Normal work of breathing. Neurologic: No focal deficits.   Lab Results  Component Value Date   CREATININE 0.73 03/26/2019   BUN 12 03/26/2019   NA 139 03/26/2019   K 4.2 03/26/2019   CL 96 03/26/2019   CO2 24 03/26/2019   Lab Results  Component Value Date   ALT 21 03/26/2019   AST 22 03/26/2019   ALKPHOS 63 03/26/2019   BILITOT 0.4 03/26/2019   Lab Results  Component Value Date   HGBA1C 5.1 03/26/2019   HGBA1C 5.3 11/13/2018   Lab Results  Component Value Date   INSULIN 6.6 03/26/2019   INSULIN 13.1 11/13/2018   Lab Results  Component Value Date   TSH 1.250 11/13/2018   Lab Results  Component Value Date   CHOL 192 03/26/2019   HDL 75 03/26/2019   LDLCALC 99 03/26/2019   TRIG 101 03/26/2019   No results found for: WBC, HGB, HCT, MCV, PLT No results found for: IRON, TIBC, FERRITIN  Attestation Statements:   Reviewed by clinician on day of visit: allergies, medications, problem list, medical history, surgical history, family history, social history, and previous encounter notes.   Wilhemena Durie, am acting as Location manager for [employer], FNP-C.  I have reviewed the above documentation for accuracy and completeness, and I agree with the above. -  Georgianne Fick, FNP

## 2019-04-30 DIAGNOSIS — Z23 Encounter for immunization: Secondary | ICD-10-CM | POA: Diagnosis not present

## 2019-05-20 ENCOUNTER — Other Ambulatory Visit (INDEPENDENT_AMBULATORY_CARE_PROVIDER_SITE_OTHER): Payer: Self-pay | Admitting: Family Medicine

## 2019-05-20 DIAGNOSIS — E559 Vitamin D deficiency, unspecified: Secondary | ICD-10-CM

## 2019-05-20 MED ORDER — VITAMIN D (ERGOCALCIFEROL) 1.25 MG (50000 UNIT) PO CAPS: 50000.0000 [IU] | ORAL_CAPSULE | ORAL | 0 refills | Status: DC

## 2019-05-20 NOTE — Addendum Note (Signed)
Addended by: Jesse Sans on: 05/20/2019 03:21 PM   Modules accepted: Orders

## 2019-05-22 ENCOUNTER — Other Ambulatory Visit (INDEPENDENT_AMBULATORY_CARE_PROVIDER_SITE_OTHER): Payer: Self-pay | Admitting: Family Medicine

## 2019-05-22 DIAGNOSIS — Z23 Encounter for immunization: Secondary | ICD-10-CM | POA: Diagnosis not present

## 2019-05-22 DIAGNOSIS — I10 Essential (primary) hypertension: Secondary | ICD-10-CM

## 2019-05-25 DIAGNOSIS — M25551 Pain in right hip: Secondary | ICD-10-CM | POA: Diagnosis not present

## 2019-05-25 DIAGNOSIS — M7062 Trochanteric bursitis, left hip: Secondary | ICD-10-CM | POA: Diagnosis not present

## 2019-05-25 DIAGNOSIS — M7061 Trochanteric bursitis, right hip: Secondary | ICD-10-CM | POA: Diagnosis not present

## 2019-05-25 DIAGNOSIS — M25552 Pain in left hip: Secondary | ICD-10-CM | POA: Diagnosis not present

## 2019-05-26 ENCOUNTER — Ambulatory Visit (INDEPENDENT_AMBULATORY_CARE_PROVIDER_SITE_OTHER): Payer: BC Managed Care – PPO | Admitting: Family Medicine

## 2019-05-26 ENCOUNTER — Other Ambulatory Visit: Payer: Self-pay

## 2019-05-26 VITALS — BP 105/71 | HR 83 | Temp 97.9°F | Ht 62.0 in | Wt 171.0 lb

## 2019-05-26 DIAGNOSIS — E8881 Metabolic syndrome: Secondary | ICD-10-CM

## 2019-05-26 DIAGNOSIS — Z6831 Body mass index (BMI) 31.0-31.9, adult: Secondary | ICD-10-CM

## 2019-05-26 DIAGNOSIS — Z9189 Other specified personal risk factors, not elsewhere classified: Secondary | ICD-10-CM | POA: Diagnosis not present

## 2019-05-26 DIAGNOSIS — E669 Obesity, unspecified: Secondary | ICD-10-CM | POA: Diagnosis not present

## 2019-05-26 DIAGNOSIS — I10 Essential (primary) hypertension: Secondary | ICD-10-CM | POA: Diagnosis not present

## 2019-05-26 MED ORDER — METFORMIN HCL 500 MG PO TABS: 500.0000 mg | ORAL_TABLET | Freq: Two times a day (BID) | ORAL | 0 refills | Status: DC

## 2019-05-26 MED ORDER — SAXENDA 18 MG/3ML ~~LOC~~ SOPN: 3.0000 mg | PEN_INJECTOR | Freq: Every day | SUBCUTANEOUS | 0 refills | Status: DC

## 2019-05-26 MED ORDER — LISINOPRIL 10 MG PO TABS: 10.0000 mg | ORAL_TABLET | Freq: Every day | ORAL | 0 refills | Status: DC

## 2019-05-26 MED ORDER — BD PEN NEEDLE NANO 2ND GEN 32G X 4 MM MISC: 0 refills | Status: DC

## 2019-05-27 ENCOUNTER — Encounter (INDEPENDENT_AMBULATORY_CARE_PROVIDER_SITE_OTHER): Payer: Self-pay

## 2019-05-27 ENCOUNTER — Encounter (INDEPENDENT_AMBULATORY_CARE_PROVIDER_SITE_OTHER): Payer: Self-pay | Admitting: Family Medicine

## 2019-05-27 NOTE — Progress Notes (Signed)
Chief Complaint:   OBESITY Brenda Holloway is here to discuss her progress with her obesity treatment plan along with follow-up of her obesity related diagnoses. Brenda Holloway is on the Category 3 Plan and states she is following her eating plan approximately 60% of the time. Brenda Holloway states she is walking for 60 minutes 7 times per week.  Today's visit was #: 9 Starting weight: 201 lbs Starting date: 11/13/2018 Today's weight: 171 lbs Today's date: 05/26/2019 Total lbs lost to date: 30 Total lbs lost since last in-office visit: 4  Interim History: Brenda Holloway feels she should be losing more quickly. She has lost 30 lbs since 11/13/18 and I reasssured her that her weight loss is moving along at a good pace. She admits to too many extra calories (wine and snacks) over the past few weeks. She reports cravings and polyphagia.  Subjective:   1. Essential hypertension Brenda Holloway's blood pressure is low with 12.5 chlorthalidone and lisinopril. She denies chest pain or shortness of breath. BP Readings from Last 3 Encounters:  05/26/19 105/71  04/27/19 106/71  03/26/19 108/69     2. Insulin resistance Brenda Holloway notes polyphagia and cravings. She is on metformin 500 mg BID.  3. At risk for diabetes mellitus Brenda Holloway is at higher than average risk for developing diabetes due to her obesity.   Assessment/Plan:   1. Essential hypertension Brenda Holloway is working on healthy weight loss and exercise to improve blood pressure control. We will watch for signs of hypotension as she continues her lifestyle modifications. Brenda Holloway agreed to discontinue chlorthalidone, and we will refill lisinopril for 1 month.  - lisinopril (ZESTRIL) 10 MG tablet; Take 1 tablet (10 mg total) by mouth daily.  Dispense: 30 tablet; Refill: 0  2. Insulin resistance Brenda Holloway will continue to work on weight loss, exercise, and decreasing simple carbohydrates to help decrease the risk of diabetes. We will refill metformin for 1 month. Brenda Holloway agreed to follow-up  with [name] as directed to closely monitor her progress.  - metFORMIN (GLUCOPHAGE) 500 MG tablet; Take 1 tablet (500 mg total) by mouth 2 (two) times daily with a meal.  Dispense: 60 tablet; Refill: 0  3. At risk for diabetes mellitus Brenda Holloway was given approximately 15 minutes of diabetes education and counseling today. We discussed intensive lifestyle modifications today with an emphasis on weight loss as well as increasing exercise and decreasing simple carbohydrates in her diet. We also reviewed medication options with an emphasis on risk versus benefit of those discussed.   Repetitive spaced learning was employed today to elicit superior memory formation and behavioral change.  4. Class 1 obesity with serious comorbidity and body mass index (BMI) of 31.0 to 31.9 in adult, unspecified obesity type Brenda Holloway is currently in the action stage of change. As such, her goal is to continue with weight loss efforts. She has agreed to the Category 3 Plan and keeping a food journal and adhering to recommended goals of 450-600 calories and 40 grams of protein at supper daily.   We discussed various medication options to help Brenda Holloway with her weight loss efforts and we both agreed to start Saxenda 3.0 mg SubQ daily with no refills and nano needles #100 with no refills (She is to take 0.3 mg for 4 days and then increase to 0.6 mg daily).  - Liraglutide -Weight Management (SAXENDA) 18 MG/3ML SOPN; Inject 0.5 mLs (3 mg total) into the skin daily.  Dispense: 5 pen; Refill: 0 - Insulin Pen Needle (BD PEN NEEDLE NANO  2ND GEN) 32G X 4 MM MISC; Use one needle daily to inject Saxenda.  Dispense: 100 each; Refill: 0  Handout given today: Journaling.  Exercise goals: As is, plus add resistance training 2 times per week.  Behavioral modification strategies: decreasing simple carbohydrates, decreasing liquid calories and planning for success.  Brenda Holloway has agreed to follow-up with our clinic in 2 weeks. She was informed of the  importance of frequent follow-up visits to maximize her success with intensive lifestyle modifications for her multiple health conditions.   Objective:   Blood pressure 105/71, pulse 83, temperature 97.9 F (36.6 C), temperature source Oral, height 5\' 2"  (1.575 m), weight 171 lb (77.6 kg), last menstrual period 01/29/2009, SpO2 100 %. Body mass index is 31.28 kg/m.  General: Cooperative, alert, well developed, in no acute distress. HEENT: Conjunctivae and lids unremarkable. Cardiovascular: Regular rhythm.  Lungs: Normal work of breathing. Neurologic: No focal deficits.   Lab Results  Component Value Date   CREATININE 0.73 03/26/2019   BUN 12 03/26/2019   NA 139 03/26/2019   K 4.2 03/26/2019   CL 96 03/26/2019   CO2 24 03/26/2019   Lab Results  Component Value Date   ALT 21 03/26/2019   AST 22 03/26/2019   ALKPHOS 63 03/26/2019   BILITOT 0.4 03/26/2019   Lab Results  Component Value Date   HGBA1C 5.1 03/26/2019   HGBA1C 5.3 11/13/2018   Lab Results  Component Value Date   INSULIN 6.6 03/26/2019   INSULIN 13.1 11/13/2018   Lab Results  Component Value Date   TSH 1.250 11/13/2018   Lab Results  Component Value Date   CHOL 192 03/26/2019   HDL 75 03/26/2019   LDLCALC 99 03/26/2019   TRIG 101 03/26/2019   No results found for: WBC, HGB, HCT, MCV, PLT No results found for: IRON, TIBC, FERRITIN  Attestation Statements:   Reviewed by clinician on day of visit: allergies, medications, problem list, medical history, surgical history, family history, social history, and previous encounter notes.   Wilhemena Durie, am acting as Location manager for [employer], FNP-C.  I have reviewed the above documentation for accuracy and completeness, and I agree with the above. -  Georgianne Fick, FNP

## 2019-05-28 ENCOUNTER — Other Ambulatory Visit (INDEPENDENT_AMBULATORY_CARE_PROVIDER_SITE_OTHER): Payer: Self-pay | Admitting: Family Medicine

## 2019-05-28 ENCOUNTER — Encounter (INDEPENDENT_AMBULATORY_CARE_PROVIDER_SITE_OTHER): Payer: Self-pay

## 2019-05-28 DIAGNOSIS — E8881 Metabolic syndrome: Secondary | ICD-10-CM

## 2019-05-28 DIAGNOSIS — E88819 Insulin resistance, unspecified: Secondary | ICD-10-CM

## 2019-05-28 MED ORDER — VICTOZA 18 MG/3ML ~~LOC~~ SOPN: 0.6000 mg | PEN_INJECTOR | Freq: Every day | SUBCUTANEOUS | 0 refills | Status: DC

## 2019-05-28 NOTE — Progress Notes (Signed)
Saxenda not covered by insurance. Will start Victoza.

## 2019-06-10 ENCOUNTER — Ambulatory Visit (INDEPENDENT_AMBULATORY_CARE_PROVIDER_SITE_OTHER): Payer: BC Managed Care – PPO | Admitting: Family Medicine

## 2019-06-10 ENCOUNTER — Encounter (INDEPENDENT_AMBULATORY_CARE_PROVIDER_SITE_OTHER): Payer: Self-pay | Admitting: Family Medicine

## 2019-06-10 VITALS — BP 117/76 | HR 71 | Temp 97.4°F | Ht 62.0 in | Wt 168.0 lb

## 2019-06-10 DIAGNOSIS — E559 Vitamin D deficiency, unspecified: Secondary | ICD-10-CM

## 2019-06-10 DIAGNOSIS — E669 Obesity, unspecified: Secondary | ICD-10-CM

## 2019-06-10 DIAGNOSIS — Z9189 Other specified personal risk factors, not elsewhere classified: Secondary | ICD-10-CM | POA: Diagnosis not present

## 2019-06-10 DIAGNOSIS — E8881 Metabolic syndrome: Secondary | ICD-10-CM | POA: Diagnosis not present

## 2019-06-10 DIAGNOSIS — Z6830 Body mass index (BMI) 30.0-30.9, adult: Secondary | ICD-10-CM

## 2019-06-10 MED ORDER — VITAMIN D (ERGOCALCIFEROL) 1.25 MG (50000 UNIT) PO CAPS: 50000.0000 [IU] | ORAL_CAPSULE | ORAL | 0 refills | Status: DC

## 2019-06-10 NOTE — Progress Notes (Signed)
Chief Complaint:   OBESITY Brenda Holloway is here to discuss her progress with her obesity treatment plan along with follow-up of her obesity related diagnoses. Brenda Holloway is on the Category 3 Plan and keeping a food journal and adhering to recommended goals of 450-600 calories and 40 grams of protein at supper daily and states she is following her eating plan approximately 80% of the time. [name] states she is walking for 60 minutes 7 times per week.  Today's visit was #: 10 Starting weight: 201 lbs Starting date: 11/13/2018 Today's weight: 168 lbs Today's date: 06/10/2019 Total lbs lost to date: 33 Total lbs lost since last in-office visit: 3  Interim History: [name] notes going over on extra calories at times with wine. She notes having good hunger control with Victoza. She gets all of the protein in on the plan.  Subjective:   1. Insulin resistance Brenda Holloway was started on Victoza at her last visit. She denies nausea, but she notes mild constipation which she manages with Colon Cleanse.  2. Vitamin D deficiency Brenda Holloway's last Vit D level is nearly at goal, at 48.2.  3. At risk for nausea Brenda Holloway is at risk for nausea due to taking Victoza.  Assessment/Plan:   1. Insulin resistance Brenda Holloway will continue to work on weight loss, exercise, and decreasing simple carbohydrates to help decrease the risk of diabetes. Brenda Holloway will continue Victoza at 0.6 mg daily. Brenda Holloway agreed to follow-up with Brenda Holloway as directed to closely monitor her progress.  2. Vitamin D deficiency Low Vitamin D level contributes to fatigue and are associated with obesity, breast, and colon cancer. We will refill prescription Vitamin D for 1 month. Brenda Holloway will follow-up for routine testing of Vitamin D, at least 2-3 times per year to avoid over-replacement.  - Vitamin D, Ergocalciferol, (DRISDOL) 1.25 MG (50000 UNIT) CAPS capsule; Take 1 capsule (50,000 Units total) by mouth every 7 (seven) days.  Dispense: 4 capsule; Refill: 0  3. At risk  for nausea Brenda Holloway was given approximately 15 minutes of nausea prevention counseling today. Brenda Holloway is at risk for nausea due to her new or current medication. She was encouraged to titrate her medication slowly, make sure to stay hydrated, eat smaller portions throughout the day, and avoid high fat meals.   4. Class 1 obesity with serious comorbidity and body mass index (BMI) of 30.0 to 30.9 in adult, unspecified obesity type Brenda Holloway is currently in the action stage of change. As such, her goal is to continue with weight loss efforts. She has agreed to the Category 3 Plan.   Exercise goals: As is.  Behavioral modification strategies: decreasing liquid calories and planning for success.  Brenda Holloway has agreed to follow-up with our clinic in 2 weeks. She was informed of the importance of frequent follow-up visits to maximize her success with intensive lifestyle modifications for her multiple health conditions.   Objective:   Blood pressure 117/76, pulse 71, temperature (!) 97.4 F (36.3 C), temperature source Oral, height 5\' 2"  (1.575 m), weight 168 lb (76.2 kg), last menstrual period 01/29/2009, SpO2 100 %. Body mass index is 30.73 kg/m.  General: Cooperative, alert, well developed, in no acute distress. HEENT: Conjunctivae and lids unremarkable. Cardiovascular: Regular rhythm.  Lungs: Normal work of breathing. Neurologic: No focal deficits.   Lab Results  Component Value Date   CREATININE 0.73 03/26/2019   BUN 12 03/26/2019   NA 139 03/26/2019   K 4.2 03/26/2019   CL 96 03/26/2019  CO2 24 03/26/2019   Lab Results  Component Value Date   ALT 21 03/26/2019   AST 22 03/26/2019   ALKPHOS 63 03/26/2019   BILITOT 0.4 03/26/2019   Lab Results  Component Value Date   HGBA1C 5.1 03/26/2019   HGBA1C 5.3 11/13/2018   Lab Results  Component Value Date   INSULIN 6.6 03/26/2019   INSULIN 13.1 11/13/2018   Lab Results  Component Value Date   TSH 1.250 11/13/2018   Lab  Results  Component Value Date   CHOL 192 03/26/2019   HDL 75 03/26/2019   LDLCALC 99 03/26/2019   TRIG 101 03/26/2019   No results found for: WBC, HGB, HCT, MCV, PLT No results found for: IRON, TIBC, FERRITIN  Attestation Statements:   Reviewed by clinician on day of visit: allergies, medications, problem list, medical history, surgical history, family history, social history, and previous encounter notes.   Wilhemena Durie, am acting as Location manager for [employer], FNP-C.  I have reviewed the above documentation for accuracy and completeness, and I agree with the above. -  Georgianne Fick, FNP

## 2019-06-24 ENCOUNTER — Encounter (INDEPENDENT_AMBULATORY_CARE_PROVIDER_SITE_OTHER): Payer: Self-pay | Admitting: Family Medicine

## 2019-06-24 ENCOUNTER — Other Ambulatory Visit: Payer: Self-pay

## 2019-06-24 ENCOUNTER — Ambulatory Visit (INDEPENDENT_AMBULATORY_CARE_PROVIDER_SITE_OTHER): Payer: BC Managed Care – PPO | Admitting: Family Medicine

## 2019-06-24 VITALS — BP 101/69 | HR 70 | Temp 98.1°F | Ht 62.0 in | Wt 168.0 lb

## 2019-06-24 DIAGNOSIS — I10 Essential (primary) hypertension: Secondary | ICD-10-CM

## 2019-06-24 DIAGNOSIS — Z6830 Body mass index (BMI) 30.0-30.9, adult: Secondary | ICD-10-CM

## 2019-06-24 DIAGNOSIS — E8881 Metabolic syndrome: Secondary | ICD-10-CM | POA: Diagnosis not present

## 2019-06-24 DIAGNOSIS — E669 Obesity, unspecified: Secondary | ICD-10-CM

## 2019-06-24 DIAGNOSIS — Z9189 Other specified personal risk factors, not elsewhere classified: Secondary | ICD-10-CM

## 2019-06-24 MED ORDER — VICTOZA 18 MG/3ML ~~LOC~~ SOPN: 0.6000 mg | PEN_INJECTOR | Freq: Every day | SUBCUTANEOUS | 0 refills | Status: DC

## 2019-06-24 MED ORDER — LISINOPRIL 5 MG PO TABS: 5.0000 mg | ORAL_TABLET | Freq: Every day | ORAL | 0 refills | Status: DC

## 2019-06-24 NOTE — Progress Notes (Signed)
Chief Complaint:   OBESITY Brenda Holloway is here to discuss her progress with her obesity treatment plan along with follow-up of her obesity related diagnoses. Brenda Holloway is on the Category 3 Plan and states she is following her eating plan approximately 50% of the time. Brenda Holloway states she is walking 2 miles for 45 minutes 7 times per week.  Today's visit was #: 11 Starting weight: 201 lbs Starting date: 11/13/2018 Today's weight: 168 lbs Today's date: 06/24/2019 Total lbs lost to date: 33 Total lbs lost since last in-office visit: 0  Interim History: Brenda Holloway was off the plan for about a week right after her last OV, but she is now back on the plan. She is eating all of the protein on the plan.  Subjective:   1. Insulin resistance Brenda Holloway has a diagnosis of insulin resistance based on her elevated fasting insulin level >5. Brenda Holloway denies polyphagia. She notes Victoza is working well for appetite suppression at 0.6 mg daily.   Lab Results  Component Value Date   INSULIN 6.6 03/26/2019   INSULIN 13.1 11/13/2018   Lab Results  Component Value Date   HGBA1C 5.1 03/26/2019   2. Essential hypertension Brenda Holloway's blood pressure has been low recently. She notices feeling a bit light headed when changing from sitting to standing.   BP Readings from Last 3 Encounters:  06/24/19 101/69  06/10/19 117/76  05/26/19 105/71   Lab Results  Component Value Date   CREATININE 0.73 03/26/2019   CREATININE 0.81 11/13/2018   3. At risk for complication associated with hypotension The patient is at a higher than average risk of hypotension due to recent weight loss and taking antihypertensives.   Assessment/Plan:   1. Insulin resistance Brenda Holloway will continue to work on weight loss, exercise, and decreasing simple carbohydrates to help decrease the risk of diabetes. We will refill Victoza for 1 month. Brenda Holloway agreed to follow-up with [name] as directed to closely monitor her progress.  - liraglutide (VICTOZA) 18  MG/3ML SOPN; Inject 0.1 mLs (0.6 mg total) into the skin daily.  Dispense: 2 pen; Refill: 0  2. Essential hypertension Brenda Holloway is working on healthy weight loss and exercise to improve blood pressure control. We will watch for signs of hypotension as she continues her lifestyle modifications. Brenda Holloway agreed to decrease lisinopril to 5 mg daily with no refills.  - lisinopril (ZESTRIL) 5 MG tablet; Take 1 tablet (5 mg total) by mouth daily.  Dispense: 30 tablet; Refill: 0  3. At risk for complication associated with hypotension Brenda Holloway was given approximately 15 minutes of education and counseling today to help avoid hypotension. We discussed risks of hypotension with weight loss and signs of hypotension such as feeling lightheaded or unsteady.  Repetitive spaced learning was employed today to elicit superior memory formation and behavioral change.  4. Class 1 obesity with serious comorbidity and body mass index (BMI) of 30.0 to 30.9 in adult, unspecified obesity type Brenda Holloway is currently in the action stage of change. As such, her goal is to continue with weight loss efforts. She has agreed to the Category 3 Plan.   Exercise goals: As is, she is to add resistance 2 times per week.  Behavioral modification strategies: planning for success.  Brenda Holloway has agreed to follow-up with our clinic in 2 weeks. She was informed of the importance of frequent follow-up visits to maximize her success with intensive lifestyle modifications for her multiple health conditions.   Objective:   Blood pressure 101/69, pulse 70,  temperature 98.1 F (36.7 C), height 5\' 2"  (1.575 m), weight 168 lb (76.2 kg), last menstrual period 01/29/2009, SpO2 100 %. Body mass index is 30.73 kg/m.  General: Cooperative, alert, well developed, in no acute distress. HEENT: Conjunctivae and lids unremarkable. Cardiovascular: Regular rhythm.  Lungs: Normal work of breathing. Neurologic: No focal deficits.   Lab Results  Component Value  Date   CREATININE 0.73 03/26/2019   BUN 12 03/26/2019   NA 139 03/26/2019   K 4.2 03/26/2019   CL 96 03/26/2019   CO2 24 03/26/2019   Lab Results  Component Value Date   ALT 21 03/26/2019   AST 22 03/26/2019   ALKPHOS 63 03/26/2019   BILITOT 0.4 03/26/2019   Lab Results  Component Value Date   HGBA1C 5.1 03/26/2019   HGBA1C 5.3 11/13/2018   Lab Results  Component Value Date   INSULIN 6.6 03/26/2019   INSULIN 13.1 11/13/2018   Lab Results  Component Value Date   TSH 1.250 11/13/2018   Lab Results  Component Value Date   CHOL 192 03/26/2019   HDL 75 03/26/2019   LDLCALC 99 03/26/2019   TRIG 101 03/26/2019   No results found for: WBC, HGB, HCT, MCV, PLT No results found for: IRON, TIBC, FERRITIN  Attestation Statements:   Reviewed by clinician on day of visit: allergies, medications, problem list, medical history, surgical history, family history, social history, and previous encounter notes.   03/28/2019, am acting as Trude Mcburney for Energy [employer], FNP-C.  I have reviewed the above documentation for accuracy and completeness, and I agree with the above. -  [place], FNP

## 2019-07-09 ENCOUNTER — Ambulatory Visit (INDEPENDENT_AMBULATORY_CARE_PROVIDER_SITE_OTHER): Payer: BC Managed Care – PPO | Admitting: Family Medicine

## 2019-07-20 ENCOUNTER — Other Ambulatory Visit (INDEPENDENT_AMBULATORY_CARE_PROVIDER_SITE_OTHER): Payer: Self-pay | Admitting: Family Medicine

## 2019-07-20 DIAGNOSIS — E8881 Metabolic syndrome: Secondary | ICD-10-CM

## 2019-07-21 MED ORDER — VICTOZA 18 MG/3ML ~~LOC~~ SOPN: 0.6000 mg | PEN_INJECTOR | Freq: Every day | SUBCUTANEOUS | 0 refills | Status: DC

## 2019-07-28 ENCOUNTER — Encounter (INDEPENDENT_AMBULATORY_CARE_PROVIDER_SITE_OTHER): Payer: Self-pay | Admitting: Family Medicine

## 2019-07-28 ENCOUNTER — Other Ambulatory Visit: Payer: Self-pay

## 2019-07-28 ENCOUNTER — Ambulatory Visit (INDEPENDENT_AMBULATORY_CARE_PROVIDER_SITE_OTHER): Payer: BC Managed Care – PPO | Admitting: Family Medicine

## 2019-07-28 VITALS — BP 112/79 | HR 70 | Temp 97.8°F | Ht 62.0 in | Wt 169.0 lb

## 2019-07-28 DIAGNOSIS — E8881 Metabolic syndrome: Secondary | ICD-10-CM | POA: Diagnosis not present

## 2019-07-28 DIAGNOSIS — E669 Obesity, unspecified: Secondary | ICD-10-CM

## 2019-07-28 DIAGNOSIS — I10 Essential (primary) hypertension: Secondary | ICD-10-CM | POA: Diagnosis not present

## 2019-07-28 DIAGNOSIS — F3289 Other specified depressive episodes: Secondary | ICD-10-CM

## 2019-07-28 DIAGNOSIS — Z6830 Body mass index (BMI) 30.0-30.9, adult: Secondary | ICD-10-CM

## 2019-07-28 DIAGNOSIS — Z9189 Other specified personal risk factors, not elsewhere classified: Secondary | ICD-10-CM

## 2019-07-28 MED ORDER — BUPROPION HCL ER (SR) 150 MG PO TB12: 150.0000 mg | ORAL_TABLET | Freq: Every day | ORAL | 0 refills | Status: DC

## 2019-07-28 MED ORDER — BD PEN NEEDLE NANO 2ND GEN 32G X 4 MM MISC: 0 refills | Status: DC

## 2019-07-28 MED ORDER — CHLORTHALIDONE 25 MG PO TABS: 12.5000 mg | ORAL_TABLET | Freq: Every day | ORAL | 0 refills | Status: DC

## 2019-07-29 ENCOUNTER — Encounter (INDEPENDENT_AMBULATORY_CARE_PROVIDER_SITE_OTHER): Payer: Self-pay | Admitting: Family Medicine

## 2019-07-29 NOTE — Progress Notes (Signed)
Chief Complaint:   OBESITY Brenda Holloway is here to discuss her progress with her obesity treatment plan along with follow-up of her obesity related diagnoses. Brenda Holloway is on the Category 3 Plan and states she is following her eating plan approximately 25% of the time. Brenda Holloway states she is walking for 60 minutes 7 times per week.  Today's visit was #: 12 Starting weight: 201 lbs Starting date: 11/13/2018 Today's weight: 169 lbs Today's date: 07/28/2019 Total lbs lost to date: 32 Total lbs lost since last in-office visit: 0  Interim History: Brenda Holloway notes significant stress eating due to recent family stress. She has been eating extra simple carbohydrates.  Subjective:   1. Essential hypertension Brenda Holloway notes some ankle and finger swelling. She would like to start back on fluid pills. Her blood pressure is well controlled with 5 mg of lisinopril. Cardiovascular ROS: no chest pain or dyspnea on exertion.  BP Readings from Last 3 Encounters:  07/28/19 112/79  06/24/19 101/69  06/10/19 117/76   Lab Results  Component Value Date   CREATININE 0.73 03/26/2019   CREATININE 0.81 11/13/2018   2. Insulin resistance Brenda Holloway has a diagnosis of insulin resistance based on her elevated fasting insulin level >5. She notes some polyphagia on Victoza 0.6 mg daily. She continues to work on diet and exercise to decrease her risk of diabetes.  Lab Results  Component Value Date   INSULIN 6.6 03/26/2019   INSULIN 13.1 11/13/2018   Lab Results  Component Value Date   HGBA1C 5.1 03/26/2019   3. Other depression with emotional eating Brenda Holloway notes significant emotional eating due to family stress.  4. At risk for complication associated with hypotension The patient is at a higher than average risk of hypotension due to new blood pressure medications.  Assessment/Plan:   1. Essential hypertension Lakenzie is working on healthy weight loss and exercise to improve blood pressure control. We will watch for  signs of hypotension as she continues her lifestyle modifications. Jezel agreed to discontinue lisinopril and start chlorthalidone 25 mg (take 1/2 tablet at 12.5 mg daily) with no refills.  - chlorthalidone (HYGROTON) 25 MG tablet; Take 0.5 tablets (12.5 mg total) by mouth daily.  Dispense: 15 tablet; Refill: 0  2. Insulin resistance Telena will continue to work on weight loss, exercise, and decreasing simple carbohydrates to help decrease the risk of diabetes. Brenda Holloway agreed to increase Victoza to 1.2 mg daily (0.9 mg for 4 days then increase to 1.2 mg if no nausea) with no refills, and we will refill nano needles #100 with no refills. Brenda Holloway agreed to follow-up with Brenda Holloway as directed to closely monitor her progress.  - Insulin Pen Needle (BD PEN NEEDLE NANO 2ND GEN) 32G X 4 MM MISC; Use one needle daily to inject Victoza.  Dispense: 100 each; Refill: 0  3. Other depression with emotional eating Behavior modification techniques were discussed today to help Brenda Holloway deal with her emotional/non-hunger eating behaviors. Brenda Holloway agreed to start bupropion 150 mg q AM with no refills. Orders and follow up as documented in patient record.   - buPROPion (WELLBUTRIN SR) 150 MG 12 hr tablet; Take 1 tablet (150 mg total) by mouth daily. Take in the A.M.  Dispense: 30 tablet; Refill: 0  4. At risk for complication associated with hypotension Brenda Holloway was given approximately 15 minutes of education and counseling today to help avoid hypotension. We discussed risks of hypotension with weight loss and signs of hypotension such as feeling lightheaded or unsteady.  Repetitive spaced learning was employed today to elicit superior memory formation and behavioral change.  5. Class 1 obesity with serious comorbidity and body mass index (BMI) of 30.0 to 30.9 in adult, unspecified obesity type Brenda Holloway is currently in the action stage of change. As such, her goal is to continue with weight loss efforts. She has agreed to the Category 3  Plan.   Exercise goals: As is.  Behavioral modification strategies: increasing lean protein intake and decreasing simple carbohydrates.  Brenda Holloway has agreed to follow-up with our clinic in 4 weeks. She was informed of the importance of frequent follow-up visits to maximize her success with intensive lifestyle modifications for her multiple health conditions.   Objective:   Blood pressure 112/79, pulse 70, temperature 97.8 F (36.6 C), temperature source Oral, height 5\' 2"  (1.575 m), weight 169 lb (76.7 kg), last menstrual period 01/29/2009, SpO2 98 %. Body mass index is 30.91 kg/m.  General: Cooperative, alert, well developed, in no acute distress. HEENT: Conjunctivae and lids unremarkable. Cardiovascular: Regular rhythm.  Lungs: Normal work of breathing. Neurologic: No focal deficits.   Lab Results  Component Value Date   CREATININE 0.73 03/26/2019   BUN 12 03/26/2019   NA 139 03/26/2019   K 4.2 03/26/2019   CL 96 03/26/2019   CO2 24 03/26/2019   Lab Results  Component Value Date   ALT 21 03/26/2019   AST 22 03/26/2019   ALKPHOS 63 03/26/2019   BILITOT 0.4 03/26/2019   Lab Results  Component Value Date   HGBA1C 5.1 03/26/2019   HGBA1C 5.3 11/13/2018   Lab Results  Component Value Date   INSULIN 6.6 03/26/2019   INSULIN 13.1 11/13/2018   Lab Results  Component Value Date   TSH 1.250 11/13/2018   Lab Results  Component Value Date   CHOL 192 03/26/2019   HDL 75 03/26/2019   LDLCALC 99 03/26/2019   TRIG 101 03/26/2019   No results found for: WBC, HGB, HCT, MCV, PLT No results found for: IRON, TIBC, FERRITIN  Attestation Statements:   Reviewed by clinician on day of visit: allergies, medications, problem list, medical history, surgical history, family history, social history, and previous encounter notes.   03/28/2019, am acting as Trude Mcburney for Energy manager, FNP-C.  I have reviewed the above documentation for accuracy and completeness, and I  agree with the above. -  [place], FNP

## 2019-08-25 ENCOUNTER — Encounter (INDEPENDENT_AMBULATORY_CARE_PROVIDER_SITE_OTHER): Payer: Self-pay | Admitting: Family Medicine

## 2019-08-25 ENCOUNTER — Ambulatory Visit (INDEPENDENT_AMBULATORY_CARE_PROVIDER_SITE_OTHER): Payer: BC Managed Care – PPO | Admitting: Family Medicine

## 2019-08-25 ENCOUNTER — Other Ambulatory Visit: Payer: Self-pay

## 2019-08-25 VITALS — BP 119/79 | HR 77 | Temp 97.3°F | Ht 62.0 in | Wt 164.0 lb

## 2019-08-25 DIAGNOSIS — E8881 Metabolic syndrome: Secondary | ICD-10-CM

## 2019-08-25 DIAGNOSIS — I10 Essential (primary) hypertension: Secondary | ICD-10-CM

## 2019-08-25 DIAGNOSIS — F3289 Other specified depressive episodes: Secondary | ICD-10-CM

## 2019-08-25 DIAGNOSIS — Z6830 Body mass index (BMI) 30.0-30.9, adult: Secondary | ICD-10-CM

## 2019-08-25 DIAGNOSIS — E559 Vitamin D deficiency, unspecified: Secondary | ICD-10-CM | POA: Diagnosis not present

## 2019-08-25 DIAGNOSIS — E669 Obesity, unspecified: Secondary | ICD-10-CM

## 2019-08-25 MED ORDER — VICTOZA 18 MG/3ML ~~LOC~~ SOPN: 1.2000 mg | PEN_INJECTOR | Freq: Every day | SUBCUTANEOUS | 0 refills | Status: DC

## 2019-08-25 MED ORDER — CHLORTHALIDONE 25 MG PO TABS: 12.5000 mg | ORAL_TABLET | Freq: Every day | ORAL | 0 refills | Status: DC

## 2019-08-25 MED ORDER — BUPROPION HCL ER (SR) 150 MG PO TB12: 150.0000 mg | ORAL_TABLET | Freq: Every day | ORAL | 0 refills | Status: DC

## 2019-08-25 NOTE — Progress Notes (Signed)
Chief Complaint:   OBESITY Brenda Holloway is here to discuss her progress with her obesity treatment plan along with follow-up of her obesity related diagnoses. Brenda Holloway is on the Category 3 Plan and states she is following her eating plan approximately 70% of the time. Brenda Holloway states she is walking for 60 minutes 7 times per week.  Today's visit was #: 13 Starting weight: 201 lbs Starting date: 11/13/2018 Today's weight: 164 lbs Today's date: 08/25/2019 Total lbs lost to date: 37 Total lbs lost since last in-office visit: 5  Interim History: Brenda Holloway has tightened up on the plan and she has decreased carbohydrates overall. She notes she has increased protein also. She is down 5 lbs today. Her goal is 140 lbs (25 BMI).  Subjective:   1. Vitamin D deficiency Brenda Holloway's last Vit D level was at goal. She is on prescription Vit D.  2. Insulin resistance Brenda Holloway has a diagnosis of insulin resistance based on her elevated fasting insulin level >5. She denies polyphagia, and she is on Victoza. She notes constipation and she uses Colon Cleanse. She continues to work on diet and exercise to decrease her risk of diabetes.  Lab Results  Component Value Date   INSULIN 6.6 03/26/2019   INSULIN 13.1 11/13/2018   Lab Results  Component Value Date   HGBA1C 5.1 03/26/2019   3. Essential hypertension Brenda Holloway's blood pressure is well controlled. She is on chlorthalidone 12.5 mg daily. Sh reports lower extremity edema has improved.  BP Readings from Last 3 Encounters:  08/25/19 119/79  07/28/19 112/79  06/24/19 101/69   Lab Results  Component Value Date   CREATININE 0.73 03/26/2019   CREATININE 0.81 11/13/2018   4. Other depression with emotional eating Brenda Holloway notes cravings are well controlled. She feels her mood is stable.  Assessment/Plan:   1. Vitamin D deficiency Low Vitamin D level contributes to fatigue and are associated with obesity, breast, and colon cancer. Brenda Holloway agreed to continue taking  prescription Vitamin D 50,000 IU weekly and we will recheck labs today. She will follow-up for routine testing of Vitamin D, at least 2-3 times per year to avoid over-replacement.  - VITAMIN D 25 Hydroxy (Vit-D Deficiency, Fractures)  2. Insulin resistance Brenda Holloway will continue to work on weight loss, exercise, and decreasing simple carbohydrates to help decrease the risk of diabetes. We will check labs today, and we will refill Victoza for 1 month. Brenda Holloway agreed to follow-up with [name] as directed to closely monitor her progress.  - Hemoglobin A1c - Insulin, random - liraglutide (VICTOZA) 18 MG/3ML SOPN; Inject 0.2 mLs (1.2 mg total) into the skin daily.  Dispense: 2 pen; Refill: 0  3. Essential hypertension Brenda Holloway is working on healthy weight loss and exercise to improve blood pressure control. We will watch for signs of hypotension as she continues her lifestyle modifications. We will check labs today, and we will refill chlorthalidone for 1 month.  - Comprehensive metabolic panel - chlorthalidone (HYGROTON) 25 MG tablet; Take 0.5 tablets (12.5 mg total) by mouth daily.  Dispense: 15 tablet; Refill: 0  4. Other depression with emotional eating Behavior modification techniques were discussed today to help Brenda Holloway deal with her emotional/non-hunger eating behaviors. We will refill bupropion for 1 month. Orders and follow up as documented in patient record.   - buPROPion (WELLBUTRIN SR) 150 MG 12 hr tablet; Take 1 tablet (150 mg total) by mouth daily. Take in the A.M.  Dispense: 30 tablet; Refill: 0  5. Class 1  obesity with serious comorbidity and body mass index (BMI) of 30.0 to 30.9 in adult, unspecified obesity type Brenda Holloway is currently in the action stage of change. As such, her goal is to continue with weight loss efforts. She has agreed to the Category 3 Plan.   Exercise goals: As is.  Behavioral modification strategies: decreasing simple carbohydrates.  Brenda Holloway has agreed to follow-up with our  clinic in 3 weeks. She was informed of the importance of frequent follow-up visits to maximize her success with intensive lifestyle modifications for her multiple health conditions.   Brenda Holloway was informed we would discuss her lab results at her next visit unless there is a critical issue that needs to be addressed sooner. Brenda Holloway agreed to keep her next visit at the agreed upon time to discuss these results.  Objective:   Blood pressure 119/79, pulse 77, temperature (!) 97.3 F (36.3 C), temperature source Oral, height 5\' 2"  (1.575 m), weight 164 lb (74.4 kg), last menstrual period 01/29/2009, SpO2 99 %. Body mass index is 30 kg/m.  General: Cooperative, alert, well developed, in no acute distress. HEENT: Conjunctivae and lids unremarkable. Cardiovascular: Regular rhythm.  Lungs: Normal work of breathing. Neurologic: No focal deficits.   Lab Results  Component Value Date   CREATININE 0.73 03/26/2019   BUN 12 03/26/2019   NA 139 03/26/2019   K 4.2 03/26/2019   CL 96 03/26/2019   CO2 24 03/26/2019   Lab Results  Component Value Date   ALT 21 03/26/2019   AST 22 03/26/2019   ALKPHOS 63 03/26/2019   BILITOT 0.4 03/26/2019   Lab Results  Component Value Date   HGBA1C 5.1 03/26/2019   HGBA1C 5.3 11/13/2018   Lab Results  Component Value Date   INSULIN 6.6 03/26/2019   INSULIN 13.1 11/13/2018   Lab Results  Component Value Date   TSH 1.250 11/13/2018   Lab Results  Component Value Date   CHOL 192 03/26/2019   HDL 75 03/26/2019   LDLCALC 99 03/26/2019   TRIG 101 03/26/2019   No results found for: WBC, HGB, HCT, MCV, PLT No results found for: IRON, TIBC, FERRITIN  Attestation Statements:   Reviewed by clinician on day of visit: allergies, medications, problem list, medical history, surgical history, family history, social history, and previous encounter notes.   03/28/2019, am acting as Trude Mcburney for Energy manager, FNP-C.  I have reviewed the above  documentation for accuracy and completeness, and I agree with the above. -  [place], FNP

## 2019-08-26 ENCOUNTER — Encounter (INDEPENDENT_AMBULATORY_CARE_PROVIDER_SITE_OTHER): Payer: Self-pay | Admitting: Family Medicine

## 2019-08-26 LAB — COMPREHENSIVE METABOLIC PANEL
ALT: 11 IU/L (ref 0–32)
AST: 15 IU/L (ref 0–40)
Albumin/Globulin Ratio: 2 (ref 1.2–2.2)
Albumin: 4.7 g/dL (ref 3.8–4.9)
Alkaline Phosphatase: 89 IU/L (ref 48–121)
BUN/Creatinine Ratio: 10 (ref 9–23)
BUN: 8 mg/dL (ref 6–24)
Bilirubin Total: 0.5 mg/dL (ref 0.0–1.2)
CO2: 27 mmol/L (ref 20–29)
Calcium: 10.1 mg/dL (ref 8.7–10.2)
Chloride: 97 mmol/L (ref 96–106)
Creatinine, Ser: 0.78 mg/dL (ref 0.57–1.00)
GFR calc Af Amer: 96 mL/min/{1.73_m2} (ref >59)
GFR calc non Af Amer: 83 mL/min/{1.73_m2} (ref >59)
Globulin, Total: 2.4 g/dL (ref 1.5–4.5)
Glucose: 82 mg/dL (ref 65–99)
Potassium: 3.8 mmol/L (ref 3.5–5.2)
Sodium: 141 mmol/L (ref 134–144)
Total Protein: 7.1 g/dL (ref 6.0–8.5)

## 2019-08-26 LAB — VITAMIN D 25 HYDROXY (VIT D DEFICIENCY, FRACTURES): Vit D, 25-Hydroxy: 45.8 ng/mL (ref 30.0–100.0)

## 2019-08-26 LAB — HEMOGLOBIN A1C
Est. average glucose Bld gHb Est-mCnc: 100 mg/dL
Hgb A1c MFr Bld: 5.1 % (ref 4.8–5.6)

## 2019-08-26 LAB — INSULIN, RANDOM: INSULIN: 4.6 u[IU]/mL (ref 2.6–24.9)

## 2019-09-15 ENCOUNTER — Ambulatory Visit (INDEPENDENT_AMBULATORY_CARE_PROVIDER_SITE_OTHER): Payer: BC Managed Care – PPO | Admitting: Family Medicine

## 2019-09-24 ENCOUNTER — Ambulatory Visit (INDEPENDENT_AMBULATORY_CARE_PROVIDER_SITE_OTHER): Payer: BC Managed Care – PPO | Admitting: Family Medicine

## 2019-09-24 ENCOUNTER — Other Ambulatory Visit: Payer: Self-pay

## 2019-09-24 ENCOUNTER — Encounter (INDEPENDENT_AMBULATORY_CARE_PROVIDER_SITE_OTHER): Payer: Self-pay | Admitting: Family Medicine

## 2019-09-24 VITALS — BP 132/85 | HR 68 | Temp 97.6°F | Ht 62.0 in | Wt 167.0 lb

## 2019-09-24 DIAGNOSIS — Z9189 Other specified personal risk factors, not elsewhere classified: Secondary | ICD-10-CM | POA: Diagnosis not present

## 2019-09-24 DIAGNOSIS — F3289 Other specified depressive episodes: Secondary | ICD-10-CM

## 2019-09-24 DIAGNOSIS — E669 Obesity, unspecified: Secondary | ICD-10-CM

## 2019-09-24 DIAGNOSIS — E559 Vitamin D deficiency, unspecified: Secondary | ICD-10-CM

## 2019-09-24 DIAGNOSIS — Z6830 Body mass index (BMI) 30.0-30.9, adult: Secondary | ICD-10-CM

## 2019-09-24 DIAGNOSIS — E8881 Metabolic syndrome: Secondary | ICD-10-CM

## 2019-09-24 MED ORDER — VITAMIN D (ERGOCALCIFEROL) 1.25 MG (50000 UNIT) PO CAPS: 50000.0000 [IU] | ORAL_CAPSULE | ORAL | 0 refills | Status: DC

## 2019-09-24 MED ORDER — VICTOZA 18 MG/3ML ~~LOC~~ SOPN: 1.8000 mg | PEN_INJECTOR | Freq: Every day | SUBCUTANEOUS | 0 refills | Status: DC

## 2019-09-24 MED ORDER — BUPROPION HCL ER (SR) 150 MG PO TB12: 150.0000 mg | ORAL_TABLET | Freq: Every day | ORAL | 0 refills | Status: DC

## 2019-09-24 NOTE — Progress Notes (Signed)
Chief Complaint:   OBESITY Brenda Holloway is here to discuss her progress with her obesity treatment plan along with follow-up of her obesity related diagnoses. Brenda Holloway is on the Category 3 Plan and states she is following her eating plan approximately 40% of the time. Brenda Holloway states she is walking 60 minutes 7 times per week, and resistance training in the pool 2 times per week.  Today's visit was #: 14 Starting weight: 201 lbs Starting date: 11/13/2018 Today's weight: 167 lbs Today's date: 09/24/2019 Total lbs lost to date: 34 Total lbs lost since last in-office visit: 0  Interim History: Brenda Holloway notes being off the plan on her birthday. She has had pizza and ice cream. She has not been packing lunch much when she is out and about. She feels Victoza is no longer helping with appetite suppression.  Subjective:   1. Insulin resistance Brenda Holloway notes polyphagia on Victoza, and she notes constipation but denies nausea.  2. Vitamin D deficiency Brenda Holloway's last Vit D level was slightly low at 45.8. She is on Vit D prescription.  3. Other depression with emotional eating Brenda Holloway feels bupropion helps with her mood and cravings.  4. At risk for side effect of medication Brenda Holloway is at risk for drug side effects due to increased dose of Victoza.  Assessment/Plan:   1. Insulin resistance Brenda Holloway will continue to work on weight loss, exercise, and decreasing simple carbohydrates to help decrease the risk of diabetes. Brenda Holloway agreed to increase Victoza to 1.8 mg SubQ daily with no refills. Brenda Holloway agreed to follow-up with [name] as directed to closely monitor her progress.  - liraglutide (VICTOZA) 18 MG/3ML SOPN; Inject 0.3 mLs (1.8 mg total) into the skin daily.  Dispense: 9 mL; Refill: 0  2. Vitamin D deficiency Low Vitamin D level contributes to fatigue and are associated with obesity, breast, and colon cancer. We will refill prescription Vitamin D for 1 month. Brenda Holloway will follow-up for routine testing of Vitamin D,  at least 2-3 times per year to avoid over-replacement.  - Vitamin D, Ergocalciferol, (DRISDOL) 1.25 MG (50000 UNIT) CAPS capsule; Take 1 capsule (50,000 Units total) by mouth every 7 (seven) days.  Dispense: 4 capsule; Refill: 0  3. Other depression with emotional eating Behavior modification techniques were discussed today to help Brenda Holloway deal with her emotional/non-hunger eating behaviors. We will refill bupropion for 1 month. Orders and follow up as documented in patient record.   - buPROPion (WELLBUTRIN SR) 150 MG 12 hr tablet; Take 1 tablet (150 mg total) by mouth daily. Take in the A.M.  Dispense: 30 tablet; Refill: 0  4. At risk for side effect of medication Brenda Holloway was given approximately 15 minutes of drug side effect counseling today.  We discussed side effect possibility and risk versus benefits. Brenda Holloway agreed to the medication and will contact this office if these side effects are intolerable.  Repetitive spaced learning was employed today to elicit superior memory formation and behavioral change.  5. Class 1 obesity with serious comorbidity and body mass index (BMI) of 30.0 to 30.9 in adult, unspecified obesity type Brenda Holloway is currently in the action stage of change. As such, her goal is to continue with weight loss efforts. She has agreed to the Category 3 Plan.   Exercise goals: As is.  Behavioral modification strategies: increasing lean protein intake, decreasing simple carbohydrates and meal planning and cooking strategies.  Brenda Holloway has agreed to follow-up with our clinic in 4 weeks. She was informed of the importance  of frequent follow-up visits to maximize her success with intensive lifestyle modifications for her multiple health conditions.   Objective:   Blood pressure 132/85, pulse 68, temperature 97.6 F (36.4 C), temperature source Oral, height 5\' 2"  (1.575 m), weight 167 lb (75.8 kg), last menstrual period 01/29/2009, SpO2 100 %. Body mass index is 30.54 kg/m.  General:  Cooperative, alert, well developed, in no acute distress. HEENT: Conjunctivae and lids unremarkable. Cardiovascular: Regular rhythm.  Lungs: Normal work of breathing. Neurologic: No focal deficits.   Lab Results  Component Value Date   CREATININE 0.78 08/25/2019   BUN 8 08/25/2019   NA 141 08/25/2019   K 3.8 08/25/2019   CL 97 08/25/2019   CO2 27 08/25/2019   Lab Results  Component Value Date   ALT 11 08/25/2019   AST 15 08/25/2019   ALKPHOS 89 08/25/2019   BILITOT 0.5 08/25/2019   Lab Results  Component Value Date   HGBA1C 5.1 08/25/2019   HGBA1C 5.1 03/26/2019   HGBA1C 5.3 11/13/2018   Lab Results  Component Value Date   INSULIN 4.6 08/25/2019   INSULIN 6.6 03/26/2019   INSULIN 13.1 11/13/2018   Lab Results  Component Value Date   TSH 1.250 11/13/2018   Lab Results  Component Value Date   CHOL 192 03/26/2019   HDL 75 03/26/2019   LDLCALC 99 03/26/2019   TRIG 101 03/26/2019   No results found for: WBC, HGB, HCT, MCV, PLT No results found for: IRON, TIBC, FERRITIN  Attestation Statements:   Reviewed by clinician on day of visit: allergies, medications, problem list, medical history, surgical history, family history, social history, and previous encounter notes.   03/28/2019, am acting as Trude Mcburney for Energy manager, FNP-C.  I have reviewed the above documentation for accuracy and completeness, and I agree with the above. -  [place], FNP

## 2019-09-28 ENCOUNTER — Encounter (INDEPENDENT_AMBULATORY_CARE_PROVIDER_SITE_OTHER): Payer: Self-pay | Admitting: Family Medicine

## 2019-09-28 DIAGNOSIS — F32A Depression, unspecified: Secondary | ICD-10-CM | POA: Insufficient documentation

## 2019-10-01 DIAGNOSIS — M19011 Primary osteoarthritis, right shoulder: Secondary | ICD-10-CM | POA: Diagnosis not present

## 2019-10-01 DIAGNOSIS — M7061 Trochanteric bursitis, right hip: Secondary | ICD-10-CM | POA: Diagnosis not present

## 2019-10-01 DIAGNOSIS — M24811 Other specific joint derangements of right shoulder, not elsewhere classified: Secondary | ICD-10-CM | POA: Diagnosis not present

## 2019-10-22 ENCOUNTER — Encounter (INDEPENDENT_AMBULATORY_CARE_PROVIDER_SITE_OTHER): Payer: Self-pay | Admitting: Family Medicine

## 2019-10-22 ENCOUNTER — Other Ambulatory Visit: Payer: Self-pay

## 2019-10-22 ENCOUNTER — Ambulatory Visit (INDEPENDENT_AMBULATORY_CARE_PROVIDER_SITE_OTHER): Payer: BC Managed Care – PPO | Admitting: Family Medicine

## 2019-10-22 VITALS — BP 117/79 | HR 68 | Temp 97.6°F | Ht 62.0 in | Wt 162.0 lb

## 2019-10-22 DIAGNOSIS — F3289 Other specified depressive episodes: Secondary | ICD-10-CM | POA: Diagnosis not present

## 2019-10-22 DIAGNOSIS — M1611 Unilateral primary osteoarthritis, right hip: Secondary | ICD-10-CM | POA: Diagnosis not present

## 2019-10-22 DIAGNOSIS — I10 Essential (primary) hypertension: Secondary | ICD-10-CM

## 2019-10-22 DIAGNOSIS — E8881 Metabolic syndrome: Secondary | ICD-10-CM | POA: Diagnosis not present

## 2019-10-22 DIAGNOSIS — M7061 Trochanteric bursitis, right hip: Secondary | ICD-10-CM | POA: Diagnosis not present

## 2019-10-22 DIAGNOSIS — E669 Obesity, unspecified: Secondary | ICD-10-CM

## 2019-10-22 DIAGNOSIS — E559 Vitamin D deficiency, unspecified: Secondary | ICD-10-CM | POA: Diagnosis not present

## 2019-10-22 DIAGNOSIS — E88819 Insulin resistance, unspecified: Secondary | ICD-10-CM

## 2019-10-22 DIAGNOSIS — Z6830 Body mass index (BMI) 30.0-30.9, adult: Secondary | ICD-10-CM

## 2019-10-22 MED ORDER — CHLORTHALIDONE 25 MG PO TABS: 12.5000 mg | ORAL_TABLET | Freq: Every day | ORAL | 0 refills | Status: DC

## 2019-10-22 MED ORDER — BD PEN NEEDLE NANO 2ND GEN 32G X 4 MM MISC: 0 refills | Status: DC

## 2019-10-22 MED ORDER — VITAMIN D (ERGOCALCIFEROL) 1.25 MG (50000 UNIT) PO CAPS: 50000.0000 [IU] | ORAL_CAPSULE | ORAL | 0 refills | Status: DC

## 2019-10-22 MED ORDER — BUPROPION HCL ER (SR) 150 MG PO TB12: 150.0000 mg | ORAL_TABLET | Freq: Every day | ORAL | 0 refills | Status: DC

## 2019-10-22 MED ORDER — VICTOZA 18 MG/3ML ~~LOC~~ SOPN: 1.8000 mg | PEN_INJECTOR | Freq: Every day | SUBCUTANEOUS | 0 refills | Status: DC

## 2019-10-23 NOTE — Progress Notes (Signed)
Chief Complaint:   OBESITY Brenda Holloway is here to discuss her progress with her obesity treatment plan along with follow-up of her obesity related diagnoses. Brenda Holloway is on the Category 3 Plan and states she is following her eating plan approximately 90% of the time. [name] states she is walking for 60 minutes 7 times per week.  Today's visit was #: 15 Starting weight: 201 lbs Starting date: 11/13/2018 Today's weight: 162 lbs Today's date: 10/22/2019 Total lbs lost to date: 39 Total lbs lost since last in-office visit: 5  Interim History: Brenda Holloway has adhered to the plan very well. She was determined to get off the weight she had gained at her last OV. She is drinking 120 oz of water per day. We discussed the Low carbohydrate plan and she would like to try it.  Subjective:   1. Insulin resistance Doll has a diagnosis of insulin resistance based on her elevated fasting insulin level >5. She denies polyphagia or nausea with Victoza.  Lab Results  Component Value Date   INSULIN 4.6 08/25/2019   INSULIN 6.6 03/26/2019   INSULIN 13.1 11/13/2018   Lab Results  Component Value Date   HGBA1C 5.1 08/25/2019   2. Vitamin D deficiency Brenda Holloway's Vit D is nearly at goal at 45.8. She is on weekly prescription Vit D.  3. Essential hypertension [name]'s blood pressure is well controlled on 12.5 mg chlorthalidone daily.   BP Readings from Last 3 Encounters:  10/22/19 117/79  09/24/19 132/85  08/25/19 119/79   Lab Results  Component Value Date   CREATININE 0.78 08/25/2019   CREATININE 0.73 03/26/2019   CREATININE 0.81 11/13/2018   4. Other depression with emotional eating Brenda Holloway notes her cravings are well controlled with bupropion.  Assessment/Plan:   1. Insulin resistance  We will refill Victoza for 1 month, and we will refill nano needles #100, with no refill.  - liraglutide (VICTOZA) 18 MG/3ML SOPN; Inject 1.8 mg into the skin daily.  Dispense: 9 mL; Refill: 0 - Insulin Pen Needle (BD  PEN NEEDLE NANO 2ND GEN) 32G X 4 MM MISC; Use one needle daily to inject Victoza.  Dispense: 100 each; Refill: 0  2. Vitamin D deficiency Refill prescription Vitamin D for 1 month.  - Vitamin D, Ergocalciferol, (DRISDOL) 1.25 MG (50000 UNIT) CAPS capsule; Take 1 capsule (50,000 Units total) by mouth every 7 (seven) days.  Dispense: 4 capsule; Refill: 0  3. Essential hypertension Refill chlorthalidone for 1 month.  - chlorthalidone (HYGROTON) 25 MG tablet; Take 0.5 tablets (12.5 mg total) by mouth daily.  Dispense: 15 tablet; Refill: 0  4. Other depression with emotional eating  We will refill bupropion for 1 month.    - buPROPion (WELLBUTRIN SR) 150 MG 12 hr tablet; Take 1 tablet (150 mg total) by mouth daily. Take in the A.M.  Dispense: 30 tablet; Refill: 0  5. Class 1 obesity with serious comorbidity and body mass index (BMI) of 30.0 to 30.9 in adult, unspecified obesity type [name] is currently in the action stage of change. As such, her goal is to continue with weight loss efforts. She has agreed to following a lower carbohydrate, vegetable and lean protein rich diet plan.   Handout given today: Low Carb.  Exercise goals: increase resistance training.  Behavioral modification strategies: increasing lean protein intake, decreasing simple carbohydrates and meal planning and cooking strategies.  Brenda Holloway has agreed to follow-up with our clinic in 4 weeks. She was informed of the importance of frequent  follow-up visits to maximize her success with intensive lifestyle modifications for her multiple health conditions.   Objective:   Blood pressure 117/79, pulse 68, temperature 97.6 F (36.4 C), temperature source Oral, height 5\' 2"  (1.575 m), weight 162 lb (73.5 kg), last menstrual period 01/29/2009, SpO2 98 %. Body mass index is 29.63 kg/m.  General: Cooperative, alert, well developed, in no acute distress. HEENT: Conjunctivae and lids unremarkable. Cardiovascular: Regular rhythm.   Lungs: Normal work of breathing. Neurologic: No focal deficits.   Lab Results  Component Value Date   CREATININE 0.78 08/25/2019   BUN 8 08/25/2019   NA 141 08/25/2019   K 3.8 08/25/2019   CL 97 08/25/2019   CO2 27 08/25/2019   Lab Results  Component Value Date   ALT 11 08/25/2019   AST 15 08/25/2019   ALKPHOS 89 08/25/2019   BILITOT 0.5 08/25/2019   Lab Results  Component Value Date   HGBA1C 5.1 08/25/2019   HGBA1C 5.1 03/26/2019   HGBA1C 5.3 11/13/2018   Lab Results  Component Value Date   INSULIN 4.6 08/25/2019   INSULIN 6.6 03/26/2019   INSULIN 13.1 11/13/2018   Lab Results  Component Value Date   TSH 1.250 11/13/2018   Lab Results  Component Value Date   CHOL 192 03/26/2019   HDL 75 03/26/2019   LDLCALC 99 03/26/2019   TRIG 101 03/26/2019   No results found for: WBC, HGB, HCT, MCV, PLT No results found for: IRON, TIBC, FERRITIN  Attestation Statements:   Reviewed by clinician on day of visit: allergies, medications, problem list, medical history, surgical history, family history, social history, and previous encounter notes.   03/28/2019, am acting as Trude Mcburney for Energy manager, FNP-C.  I have reviewed the above documentation for accuracy and completeness, and I agree with the above. -  [place], FNP

## 2019-10-26 ENCOUNTER — Encounter (INDEPENDENT_AMBULATORY_CARE_PROVIDER_SITE_OTHER): Payer: Self-pay | Admitting: Family Medicine

## 2019-11-01 DIAGNOSIS — Z20822 Contact with and (suspected) exposure to covid-19: Secondary | ICD-10-CM | POA: Diagnosis not present

## 2019-11-04 ENCOUNTER — Other Ambulatory Visit: Payer: Self-pay | Admitting: Family Medicine

## 2019-11-04 DIAGNOSIS — M1611 Unilateral primary osteoarthritis, right hip: Secondary | ICD-10-CM

## 2019-11-19 ENCOUNTER — Ambulatory Visit (INDEPENDENT_AMBULATORY_CARE_PROVIDER_SITE_OTHER): Payer: BC Managed Care – PPO | Admitting: Family Medicine

## 2019-11-26 ENCOUNTER — Ambulatory Visit
Admission: RE | Admit: 2019-11-26 | Discharge: 2019-11-26 | Disposition: A | Discharge status: Home / Self Care | Payer: BC Managed Care – PPO | Source: Ambulatory Visit | Attending: Family Medicine | Admitting: Family Medicine

## 2019-11-26 ENCOUNTER — Other Ambulatory Visit: Payer: Self-pay

## 2019-11-26 DIAGNOSIS — M25551 Pain in right hip: Secondary | ICD-10-CM | POA: Diagnosis not present

## 2019-11-26 DIAGNOSIS — M1611 Unilateral primary osteoarthritis, right hip: Secondary | ICD-10-CM

## 2019-12-01 DIAGNOSIS — B349 Viral infection, unspecified: Secondary | ICD-10-CM | POA: Diagnosis not present

## 2019-12-03 ENCOUNTER — Encounter (INDEPENDENT_AMBULATORY_CARE_PROVIDER_SITE_OTHER): Payer: Self-pay

## 2019-12-03 ENCOUNTER — Encounter (INDEPENDENT_AMBULATORY_CARE_PROVIDER_SITE_OTHER): Payer: Self-pay | Admitting: Family Medicine

## 2019-12-03 ENCOUNTER — Telehealth (INDEPENDENT_AMBULATORY_CARE_PROVIDER_SITE_OTHER): Payer: BC Managed Care – PPO | Admitting: Family Medicine

## 2019-12-03 ENCOUNTER — Other Ambulatory Visit: Payer: Self-pay

## 2019-12-03 ENCOUNTER — Telehealth (INDEPENDENT_AMBULATORY_CARE_PROVIDER_SITE_OTHER): Payer: Self-pay

## 2019-12-03 VITALS — BP 120/80 | Ht 62.0 in | Wt 165.0 lb

## 2019-12-03 DIAGNOSIS — F3289 Other specified depressive episodes: Secondary | ICD-10-CM | POA: Diagnosis not present

## 2019-12-03 DIAGNOSIS — E88819 Insulin resistance, unspecified: Secondary | ICD-10-CM

## 2019-12-03 DIAGNOSIS — E559 Vitamin D deficiency, unspecified: Secondary | ICD-10-CM | POA: Diagnosis not present

## 2019-12-03 DIAGNOSIS — E8881 Metabolic syndrome: Secondary | ICD-10-CM

## 2019-12-03 DIAGNOSIS — Z6830 Body mass index (BMI) 30.0-30.9, adult: Secondary | ICD-10-CM

## 2019-12-03 DIAGNOSIS — E669 Obesity, unspecified: Secondary | ICD-10-CM | POA: Diagnosis not present

## 2019-12-03 MED ORDER — VICTOZA 18 MG/3ML ~~LOC~~ SOPN: 1.8000 mg | PEN_INJECTOR | Freq: Every day | SUBCUTANEOUS | 0 refills | Status: DC

## 2019-12-03 MED ORDER — WEGOVY 0.25 MG/0.5ML ~~LOC~~ SOAJ: 0.2500 mg | SUBCUTANEOUS | 0 refills | Status: DC

## 2019-12-03 MED ORDER — BUPROPION HCL ER (SR) 150 MG PO TB12: 150.0000 mg | ORAL_TABLET | Freq: Every day | ORAL | 0 refills | Status: DC

## 2019-12-03 MED ORDER — VITAMIN D (ERGOCALCIFEROL) 1.25 MG (50000 UNIT) PO CAPS: 50000.0000 [IU] | ORAL_CAPSULE | ORAL | 0 refills | Status: DC

## 2019-12-03 NOTE — Telephone Encounter (Signed)
Pt contacted. Verbal telehealth consent obtained to conduct visit via video call.   Esthefany Herrig LPN 

## 2019-12-03 NOTE — Progress Notes (Signed)
TeleHealth Visit:  Due to the COVID-19 pandemic, this visit was completed with telemedicine (audio/video) technology to reduce patient and provider exposure as well as to preserve personal protective equipment.   Kirti has verbally consented to this TeleHealth visit. The patient is located at home, the provider is located at the [facility] office. The participants in this visit include the listed provider and patient. The visit was conducted today via MyChart video.   Chief Complaint: OBESITY Brenda Holloway is here to discuss her progress with her obesity treatment plan along with follow-up of her obesity related diagnoses. Brenda Holloway is on following a lower carbohydrate, vegetable and lean protein rich diet plan and states she is following her eating plan approximately 0% of the time. Brenda Holloway states she is doing 0 minutes 0 times per week.  Today's visit was #: 16 Starting weight: 201 lbs Starting date: 11/13/2018  Interim History: Brenda Holloway notes her mother died within the last few weeks after a struggle with dementia. She has been off the plan due to her mother's death. Now she is sick with URI which prompted her video visit today. She weighed at 165 lbs at home today, and has gained 3 lbs. She is having severe right hip pain and had a recent MRI which shows severe DJD. She sees Ortho next week. She currently has to use a cane for ambulation.   Subjective:   1. Insulin resistance Brenda Holloway is on Victoza 1.8 mg. She does not feel this is helping for appetite at this point.   Lab Results  Component Value Date   INSULIN 4.6 08/25/2019   INSULIN 6.6 03/26/2019   INSULIN 13.1 11/13/2018   Lab Results  Component Value Date   HGBA1C 5.1 08/25/2019   2. Vitamin D deficiency Brenda Holloway's Vit D level is slightly low at 45.8 (08/25/2019). She is on weekly prescription Vit D.  3. Other depression with emotional eating Brenda Holloway is on bupropion which seems to keep her mood is stable and reduce  cravings.  Assessment/Plan:   1. Insulin resistance . We will refill Victoza for 1 month, and we will try to get prior authorization for [facility] or Saxenda. - liraglutide (VICTOZA) 18 MG/3ML SOPN; Inject 1.8 mg into the skin daily.  Dispense: 9 mL; Refill: 0  2. Vitamin D deficiency  We will refill prescription Vitamin D for 1 month. Brenda Holloway will follow-up for routine testing of Vitamin D, at least 2-3 times per year to avoid over-replacement.  - Vitamin D, Ergocalciferol, (DRISDOL) 1.25 MG (50000 UNIT) CAPS capsule; Take 1 capsule (50,000 Units total) by mouth every 7 (seven) days.  Dispense: 4 capsule; Refill: 0  3. Other depression with emotional eating  We will refill bupropion for 1 month.    - buPROPion (WELLBUTRIN SR) 150 MG 12 hr tablet; Take 1 tablet (150 mg total) by mouth daily. Take in the A.M.  Dispense: 30 tablet; Refill: 0  4. Class 1 obesity with serious comorbidity and body mass index (BMI) of 30.0 to 30.9 in adult, unspecified obesity type Brenda Holloway is currently in the action stage of change. As such, her goal is to continue with weight loss efforts. She has agreed to the Category 3 Plan or following a lower carbohydrate, vegetable and lean protein rich diet plan.   We will try to get prior authorization for [facility] or Saxenda.  Exercise goals: No exercise has been prescribed at this time.  Behavioral modification strategies: increasing lean protein intake and decreasing simple carbohydrates.  Brenda Holloway has agreed to follow-up with our clinic in 4 weeks.   Objective:   VITALS: Per patient if applicable, see vitals. GENERAL: Alert and in no acute distress. CARDIOPULMONARY: No increased WOB. Speaking in clear sentences.  PSYCH: Pleasant and cooperative. Speech normal rate and rhythm. Affect is appropriate. Insight and judgement are appropriate. Attention is focused, linear, and appropriate.  NEURO: Oriented as arrived to appointment on time with no prompting.   Lab Results   Component Value Date   CREATININE 0.78 08/25/2019   BUN 8 08/25/2019   NA 141 08/25/2019   K 3.8 08/25/2019   CL 97 08/25/2019   CO2 27 08/25/2019   Lab Results  Component Value Date   ALT 11 08/25/2019   AST 15 08/25/2019   ALKPHOS 89 08/25/2019   BILITOT 0.5 08/25/2019   Lab Results  Component Value Date   HGBA1C 5.1 08/25/2019   HGBA1C 5.1 03/26/2019   HGBA1C 5.3 11/13/2018   Lab Results  Component Value Date   INSULIN 4.6 08/25/2019   INSULIN 6.6 03/26/2019   INSULIN 13.1 11/13/2018   Lab Results  Component Value Date   TSH 1.250 11/13/2018   Lab Results  Component Value Date   CHOL 192 03/26/2019   HDL 75 03/26/2019   LDLCALC 99 03/26/2019   TRIG 101 03/26/2019   No results found for: WBC, HGB, HCT, MCV, PLT No results found for: IRON, TIBC, FERRITIN  Attestation Statements:   Reviewed by clinician on day of visit: allergies, medications, problem list, medical history, surgical history, family history, social history, and previous encounter notes.   Trude Mcburney, am acting as Energy manager for [place], FNP-C.  I have reviewed the above documentation for accuracy and completeness, and I agree with the above. - Jesse Sans, FNP

## 2019-12-07 ENCOUNTER — Encounter (INDEPENDENT_AMBULATORY_CARE_PROVIDER_SITE_OTHER): Payer: Self-pay | Admitting: Family Medicine

## 2019-12-10 DIAGNOSIS — M1611 Unilateral primary osteoarthritis, right hip: Secondary | ICD-10-CM | POA: Diagnosis not present

## 2019-12-14 DIAGNOSIS — M24811 Other specific joint derangements of right shoulder, not elsewhere classified: Secondary | ICD-10-CM | POA: Diagnosis not present

## 2019-12-21 DIAGNOSIS — Z01818 Encounter for other preprocedural examination: Secondary | ICD-10-CM | POA: Diagnosis not present

## 2019-12-21 DIAGNOSIS — I10 Essential (primary) hypertension: Secondary | ICD-10-CM | POA: Diagnosis not present

## 2019-12-21 DIAGNOSIS — E669 Obesity, unspecified: Secondary | ICD-10-CM | POA: Diagnosis not present

## 2019-12-21 DIAGNOSIS — F419 Anxiety disorder, unspecified: Secondary | ICD-10-CM | POA: Diagnosis not present

## 2019-12-23 DIAGNOSIS — Z01818 Encounter for other preprocedural examination: Secondary | ICD-10-CM | POA: Diagnosis not present

## 2019-12-23 DIAGNOSIS — E876 Hypokalemia: Secondary | ICD-10-CM | POA: Diagnosis not present

## 2019-12-29 ENCOUNTER — Other Ambulatory Visit (INDEPENDENT_AMBULATORY_CARE_PROVIDER_SITE_OTHER): Payer: Self-pay | Admitting: Family Medicine

## 2019-12-29 DIAGNOSIS — M1611 Unilateral primary osteoarthritis, right hip: Secondary | ICD-10-CM | POA: Diagnosis not present

## 2019-12-29 DIAGNOSIS — E559 Vitamin D deficiency, unspecified: Secondary | ICD-10-CM

## 2019-12-29 DIAGNOSIS — E8881 Metabolic syndrome: Secondary | ICD-10-CM

## 2019-12-29 NOTE — Telephone Encounter (Signed)
This patient was last seen by Adah Salvage, FNP, and currently does not have an upcoming visit with her.

## 2019-12-31 ENCOUNTER — Other Ambulatory Visit (INDEPENDENT_AMBULATORY_CARE_PROVIDER_SITE_OTHER): Payer: Self-pay | Admitting: Family Medicine

## 2019-12-31 DIAGNOSIS — F3289 Other specified depressive episodes: Secondary | ICD-10-CM

## 2019-12-31 NOTE — Telephone Encounter (Signed)
Last seen by Dawn  

## 2020-01-04 NOTE — Telephone Encounter (Signed)
Refill request

## 2020-01-08 DIAGNOSIS — M1611 Unilateral primary osteoarthritis, right hip: Secondary | ICD-10-CM | POA: Diagnosis not present

## 2020-01-19 DIAGNOSIS — Z96641 Presence of right artificial hip joint: Secondary | ICD-10-CM | POA: Diagnosis not present

## 2020-01-19 DIAGNOSIS — Z471 Aftercare following joint replacement surgery: Secondary | ICD-10-CM | POA: Diagnosis not present

## 2020-01-21 DIAGNOSIS — L814 Other melanin hyperpigmentation: Secondary | ICD-10-CM | POA: Diagnosis not present

## 2020-01-21 DIAGNOSIS — L570 Actinic keratosis: Secondary | ICD-10-CM | POA: Diagnosis not present

## 2020-01-21 DIAGNOSIS — L578 Other skin changes due to chronic exposure to nonionizing radiation: Secondary | ICD-10-CM | POA: Diagnosis not present

## 2020-01-21 DIAGNOSIS — D225 Melanocytic nevi of trunk: Secondary | ICD-10-CM | POA: Diagnosis not present

## 2020-01-21 DIAGNOSIS — D2271 Melanocytic nevi of right lower limb, including hip: Secondary | ICD-10-CM | POA: Diagnosis not present

## 2020-02-08 ENCOUNTER — Other Ambulatory Visit (INDEPENDENT_AMBULATORY_CARE_PROVIDER_SITE_OTHER): Payer: Self-pay | Admitting: Family Medicine

## 2020-02-08 DIAGNOSIS — E8881 Metabolic syndrome: Secondary | ICD-10-CM

## 2020-04-01 DIAGNOSIS — M24812 Other specific joint derangements of left shoulder, not elsewhere classified: Secondary | ICD-10-CM | POA: Diagnosis not present

## 2020-04-01 DIAGNOSIS — M24811 Other specific joint derangements of right shoulder, not elsewhere classified: Secondary | ICD-10-CM | POA: Diagnosis not present

## 2020-04-07 DIAGNOSIS — Z1231 Encounter for screening mammogram for malignant neoplasm of breast: Secondary | ICD-10-CM | POA: Diagnosis not present

## 2020-04-07 DIAGNOSIS — Z6833 Body mass index (BMI) 33.0-33.9, adult: Secondary | ICD-10-CM | POA: Diagnosis not present

## 2020-04-07 DIAGNOSIS — Z01419 Encounter for gynecological examination (general) (routine) without abnormal findings: Secondary | ICD-10-CM | POA: Diagnosis not present

## 2020-06-08 DIAGNOSIS — J029 Acute pharyngitis, unspecified: Secondary | ICD-10-CM | POA: Diagnosis not present

## 2020-06-30 ENCOUNTER — Other Ambulatory Visit (INDEPENDENT_AMBULATORY_CARE_PROVIDER_SITE_OTHER): Payer: Self-pay | Admitting: Family Medicine

## 2020-06-30 DIAGNOSIS — F3289 Other specified depressive episodes: Secondary | ICD-10-CM

## 2020-06-30 NOTE — Telephone Encounter (Signed)
Last seen Dawn 

## 2020-08-18 DIAGNOSIS — H02834 Dermatochalasis of left upper eyelid: Secondary | ICD-10-CM | POA: Diagnosis not present

## 2020-08-18 DIAGNOSIS — H02831 Dermatochalasis of right upper eyelid: Secondary | ICD-10-CM | POA: Diagnosis not present

## 2020-08-18 DIAGNOSIS — H53483 Generalized contraction of visual field, bilateral: Secondary | ICD-10-CM | POA: Diagnosis not present

## 2020-08-18 DIAGNOSIS — H02413 Mechanical ptosis of bilateral eyelids: Secondary | ICD-10-CM | POA: Diagnosis not present

## 2020-08-18 DIAGNOSIS — H57813 Brow ptosis, bilateral: Secondary | ICD-10-CM | POA: Diagnosis not present

## 2020-08-30 DIAGNOSIS — S40862D Insect bite (nonvenomous) of left upper arm, subsequent encounter: Secondary | ICD-10-CM | POA: Diagnosis not present

## 2020-08-30 DIAGNOSIS — L03114 Cellulitis of left upper limb: Secondary | ICD-10-CM | POA: Diagnosis not present

## 2020-11-15 DIAGNOSIS — E559 Vitamin D deficiency, unspecified: Secondary | ICD-10-CM | POA: Diagnosis not present

## 2020-11-15 DIAGNOSIS — E782 Mixed hyperlipidemia: Secondary | ICD-10-CM | POA: Diagnosis not present

## 2020-11-15 DIAGNOSIS — N951 Menopausal and female climacteric states: Secondary | ICD-10-CM | POA: Diagnosis not present

## 2020-11-15 DIAGNOSIS — R635 Abnormal weight gain: Secondary | ICD-10-CM | POA: Diagnosis not present

## 2020-11-17 DIAGNOSIS — Z1339 Encounter for screening examination for other mental health and behavioral disorders: Secondary | ICD-10-CM | POA: Diagnosis not present

## 2020-11-17 DIAGNOSIS — Z6834 Body mass index (BMI) 34.0-34.9, adult: Secondary | ICD-10-CM | POA: Diagnosis not present

## 2020-11-17 DIAGNOSIS — E559 Vitamin D deficiency, unspecified: Secondary | ICD-10-CM | POA: Diagnosis not present

## 2020-11-17 DIAGNOSIS — E782 Mixed hyperlipidemia: Secondary | ICD-10-CM | POA: Diagnosis not present

## 2020-11-17 DIAGNOSIS — R232 Flushing: Secondary | ICD-10-CM | POA: Diagnosis not present

## 2020-11-17 DIAGNOSIS — Z1331 Encounter for screening for depression: Secondary | ICD-10-CM | POA: Diagnosis not present

## 2020-11-24 DIAGNOSIS — Z6834 Body mass index (BMI) 34.0-34.9, adult: Secondary | ICD-10-CM | POA: Diagnosis not present

## 2020-11-24 DIAGNOSIS — E782 Mixed hyperlipidemia: Secondary | ICD-10-CM | POA: Diagnosis not present

## 2020-11-24 DIAGNOSIS — N951 Menopausal and female climacteric states: Secondary | ICD-10-CM | POA: Diagnosis not present

## 2020-12-01 DIAGNOSIS — E559 Vitamin D deficiency, unspecified: Secondary | ICD-10-CM | POA: Diagnosis not present

## 2020-12-01 DIAGNOSIS — Z6834 Body mass index (BMI) 34.0-34.9, adult: Secondary | ICD-10-CM | POA: Diagnosis not present

## 2020-12-08 DIAGNOSIS — Z6833 Body mass index (BMI) 33.0-33.9, adult: Secondary | ICD-10-CM | POA: Diagnosis not present

## 2020-12-08 DIAGNOSIS — E559 Vitamin D deficiency, unspecified: Secondary | ICD-10-CM | POA: Diagnosis not present

## 2020-12-15 DIAGNOSIS — Z6833 Body mass index (BMI) 33.0-33.9, adult: Secondary | ICD-10-CM | POA: Diagnosis not present

## 2020-12-15 DIAGNOSIS — E559 Vitamin D deficiency, unspecified: Secondary | ICD-10-CM | POA: Diagnosis not present

## 2020-12-29 DIAGNOSIS — Z6832 Body mass index (BMI) 32.0-32.9, adult: Secondary | ICD-10-CM | POA: Diagnosis not present

## 2020-12-29 DIAGNOSIS — E782 Mixed hyperlipidemia: Secondary | ICD-10-CM | POA: Diagnosis not present

## 2021-01-17 DIAGNOSIS — Z6832 Body mass index (BMI) 32.0-32.9, adult: Secondary | ICD-10-CM | POA: Diagnosis not present

## 2021-01-17 DIAGNOSIS — E559 Vitamin D deficiency, unspecified: Secondary | ICD-10-CM | POA: Diagnosis not present

## 2021-01-17 IMAGING — MR MR HIP*R* W/O CM
5 series · 35 of 40 positions shown · non-contrast
Comparison: None.

CLINICAL DATA: Chronic right hip pain.

EXAM:
MR OF THE RIGHT HIP WITHOUT CONTRAST
TECHNIQUE: Multiplanar, multisequence MR imaging was performed. No intravenous
contrast was administered.

[Series 9: T2 fat-sat · coronal · right · 3.0mm · 0.89mm/px · 8 of 30 slices shown (1 of 2)]
[im 1/30]
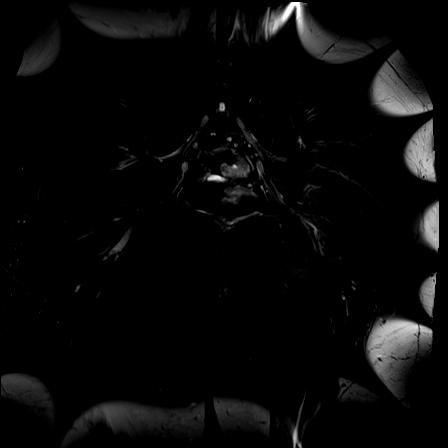
[im 5/30]
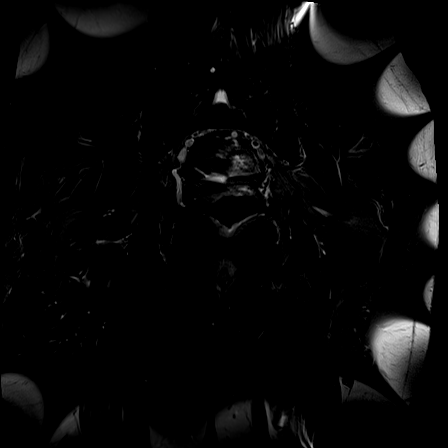
[im 9/30]
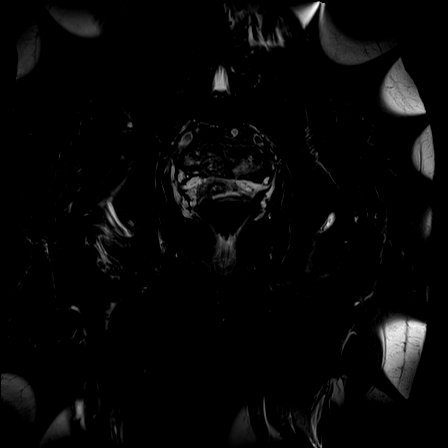
[im 13/30]
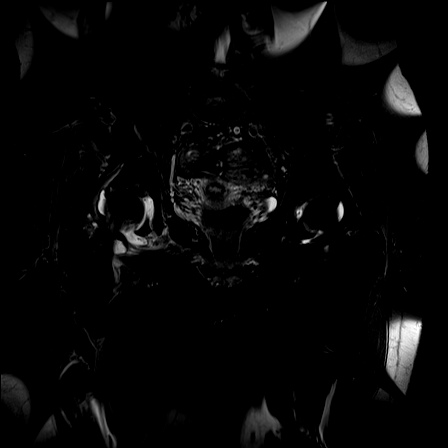
[im 17/30]
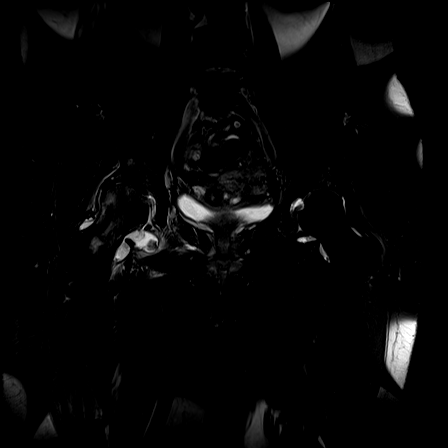
[im 21/30]
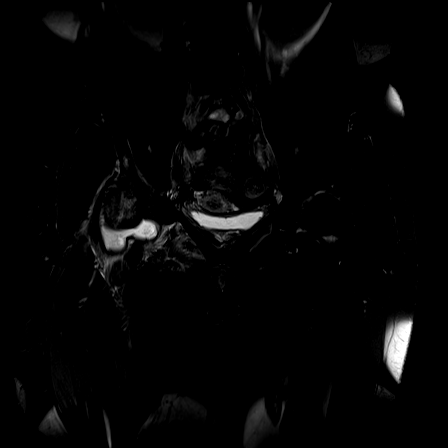
[im 25/30]
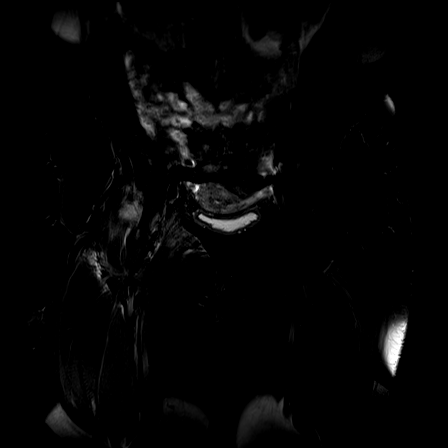
[im 30/30]
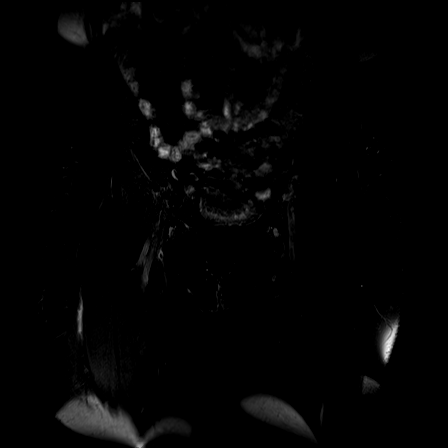

[Series 10: T1 · coronal · right · 3.0mm · 0.89mm/px · 2 of 30 slices shown]
[im 1/30]
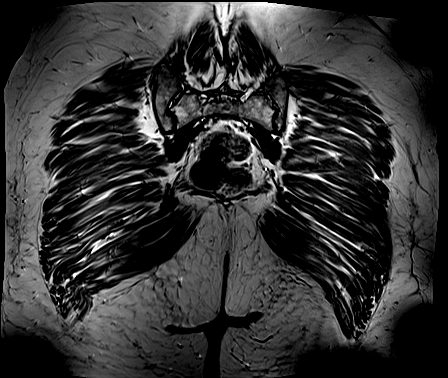
[im 5/30]
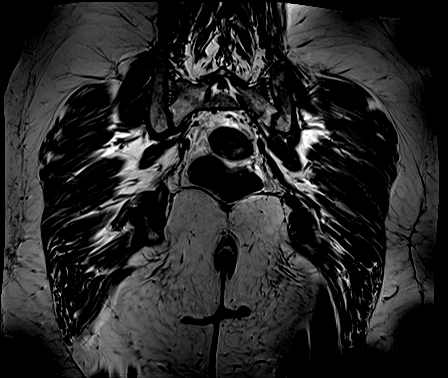

[Series 11: T2 fat-sat · axial · right · 3.0mm · 0.56mm/px · z∈[-57,+69]mm · 9 of 36 slices shown (2 of 2)]
[im 1/36]
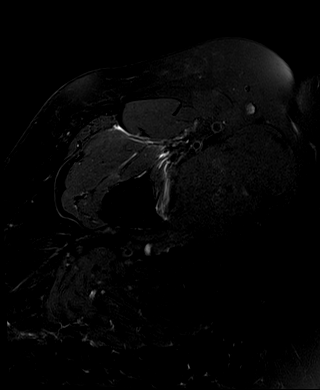
[im 5/36]
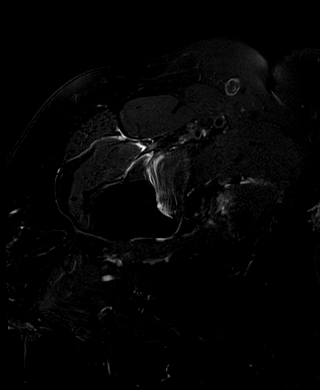
[im 9/36]
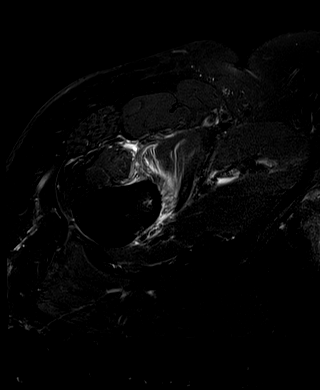
[im 14/36]
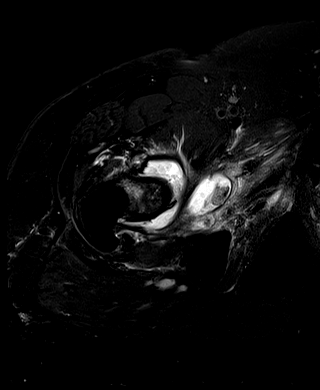
[im 18/36]
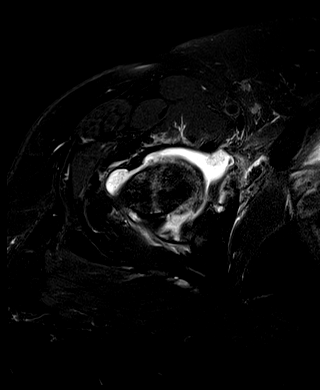
[im 22/36]
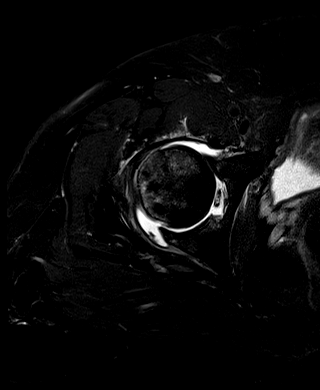
[im 27/36]
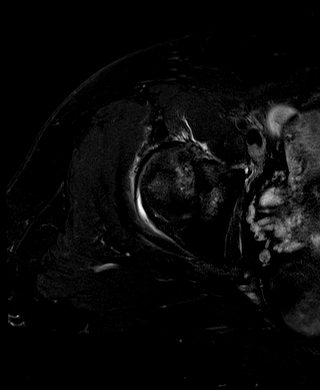
[im 31/36]
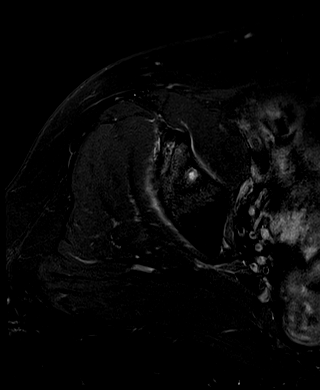
[im 36/36]
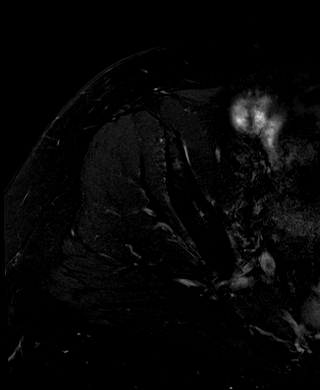

[Series 12: PD fat-sat · coronal · right · 3.0mm · 0.56mm/px · 7 of 30 slices shown (1 of 2)]
[im 1/30]
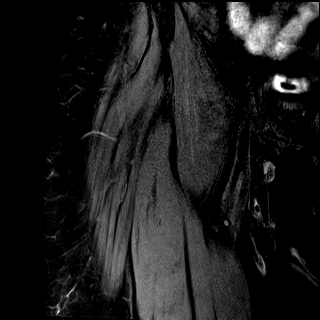
[im 5/30]
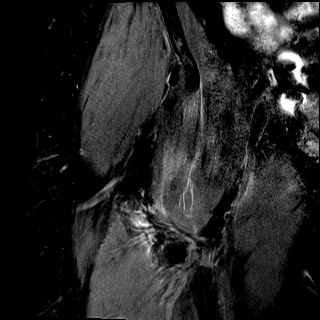
[im 10/30]
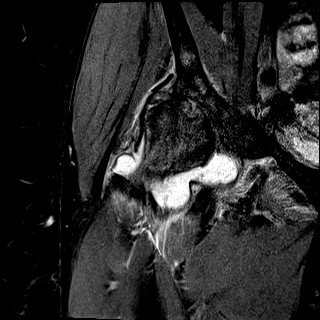
[im 15/30]
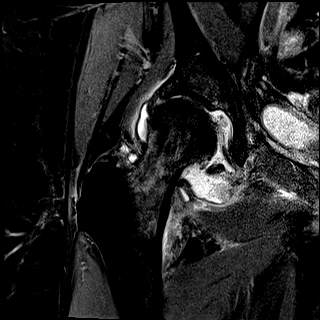
[im 20/30]
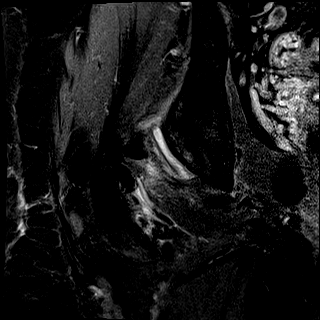
[im 25/30]
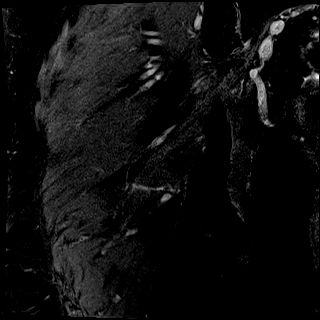
[im 30/30]
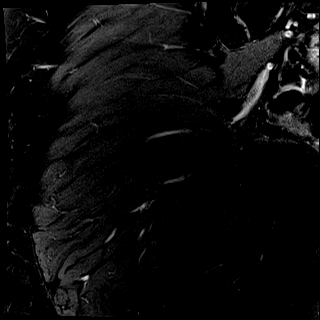

[Series 13: PD fat-sat · sagittal · right · 3.0mm · 0.56mm/px · 9 of 35 slices shown (2 of 2)]
[im 1/35]
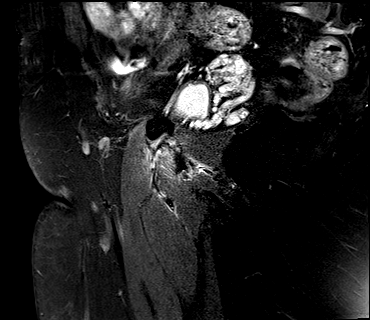
[im 5/35]
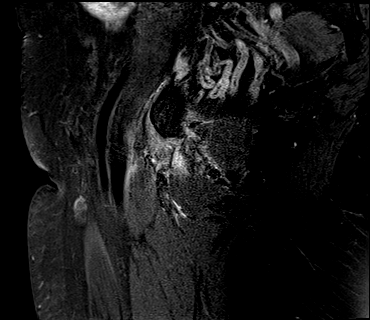
[im 9/35]
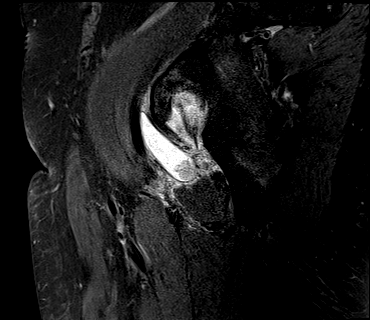
[im 13/35]
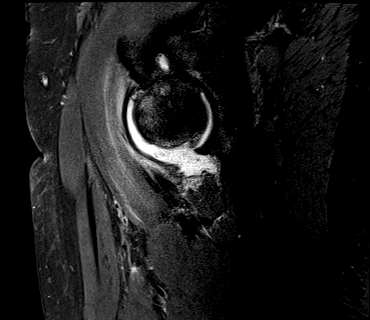
[im 18/35]
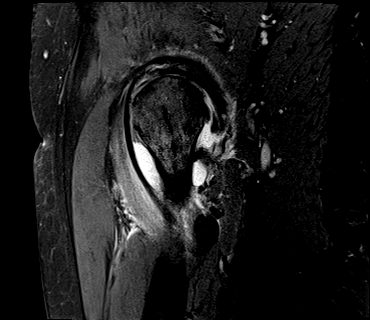
[im 22/35]
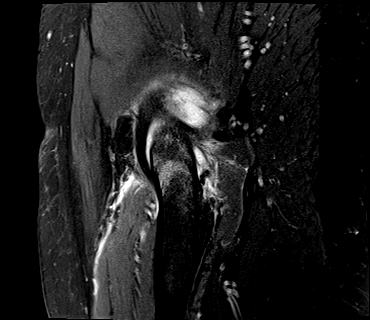
[im 26/35]
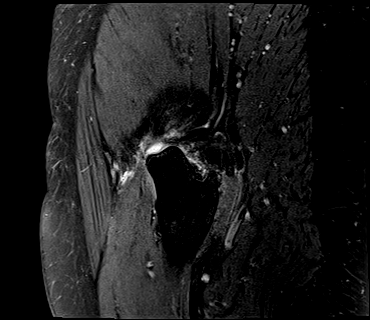
[im 30/35]
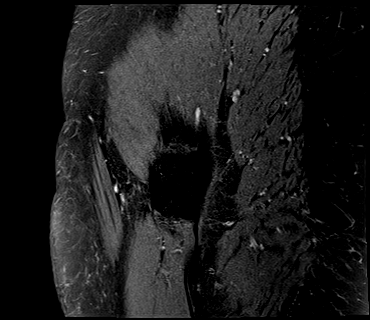
[im 35/35]
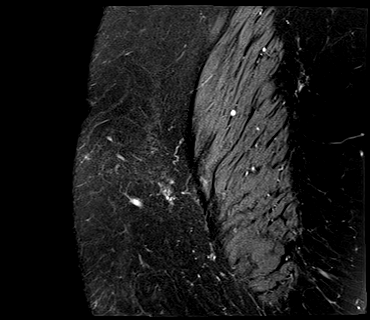

[35 of 40 positions shown; findings below may reference images not displayed]

FINDINGS: Severe right hip joint degenerative changes with areas of
full-thickness cartilage loss, joint space narrowing, osteophytic
spurring and subchondral cystic change. Large sub adjacent
acetabular and femoral head subchondral cyst. No evidence of AVN. No
stress fracture. There is a moderate to large right hip joint
effusion and evidence of fairly significant synovial thickening and
possible loose bodies. The labrum is degenerated and torn.

Moderate degenerative changes involving the left hip and associated
small joint effusion. No stress fracture or AVN.

The pubic symphysis and SI joints are intact. No pelvic fractures or
bone lesions.

Mild inflammatory type changes involving the muscles surrounding the
right hip. Suspect muscle strains or partial tears involving the
adductor muscles.

No significant intrapelvic abnormalities are identified.
IMPRESSION: 1. Severe right hip joint degenerative changes as discussed above.
There is a moderate to large right hip joint effusion and evidence
of fairly significant synovial thickening and possible loose bodies.
2. Moderate degenerative changes involving the left hip and
associated small joint effusion.
3. No stress fracture or AVN.
4. Suspect muscle strains or partial tears involving the right
adductor muscles.

## 2021-02-15 ENCOUNTER — Ambulatory Visit
Admission: RE | Admit: 2021-02-15 | Discharge: 2021-02-15 | Disposition: A | Discharge status: Home / Self Care | Payer: Managed Care, Other (non HMO) | Source: Ambulatory Visit | Attending: Family Medicine | Admitting: Family Medicine

## 2021-02-15 ENCOUNTER — Other Ambulatory Visit: Payer: Self-pay | Admitting: Family Medicine

## 2021-02-15 DIAGNOSIS — R059 Cough, unspecified: Secondary | ICD-10-CM

## 2021-09-06 ENCOUNTER — Encounter (INDEPENDENT_AMBULATORY_CARE_PROVIDER_SITE_OTHER): Payer: Self-pay

## 2021-10-05 ENCOUNTER — Encounter (INDEPENDENT_AMBULATORY_CARE_PROVIDER_SITE_OTHER): Payer: Self-pay | Admitting: Family Medicine

## 2021-10-05 DIAGNOSIS — Z0289 Encounter for other administrative examinations: Secondary | ICD-10-CM

## 2021-10-11 ENCOUNTER — Ambulatory Visit (INDEPENDENT_AMBULATORY_CARE_PROVIDER_SITE_OTHER): Payer: Managed Care, Other (non HMO) | Admitting: Family Medicine

## 2021-10-11 ENCOUNTER — Encounter (INDEPENDENT_AMBULATORY_CARE_PROVIDER_SITE_OTHER): Payer: Self-pay | Admitting: Family Medicine

## 2021-10-11 VITALS — BP 137/94 | HR 80 | Temp 98.0°F | Ht 62.0 in | Wt 167.0 lb

## 2021-10-11 DIAGNOSIS — F3289 Other specified depressive episodes: Secondary | ICD-10-CM | POA: Diagnosis not present

## 2021-10-11 DIAGNOSIS — E559 Vitamin D deficiency, unspecified: Secondary | ICD-10-CM

## 2021-10-11 DIAGNOSIS — I10 Essential (primary) hypertension: Secondary | ICD-10-CM

## 2021-10-11 DIAGNOSIS — E669 Obesity, unspecified: Secondary | ICD-10-CM | POA: Diagnosis not present

## 2021-10-11 DIAGNOSIS — R0602 Shortness of breath: Secondary | ICD-10-CM | POA: Diagnosis not present

## 2021-10-11 DIAGNOSIS — E8881 Metabolic syndrome: Secondary | ICD-10-CM | POA: Diagnosis not present

## 2021-10-11 DIAGNOSIS — E88819 Insulin resistance, unspecified: Secondary | ICD-10-CM

## 2021-10-11 DIAGNOSIS — R5383 Other fatigue: Secondary | ICD-10-CM

## 2021-10-11 DIAGNOSIS — Z6830 Body mass index (BMI) 30.0-30.9, adult: Secondary | ICD-10-CM

## 2021-10-12 ENCOUNTER — Encounter (INDEPENDENT_AMBULATORY_CARE_PROVIDER_SITE_OTHER): Payer: Self-pay

## 2021-10-17 NOTE — Progress Notes (Signed)
Chief Complaint:   OBESITY Brenda Holloway (MR# [id number]) is a 61 y.o. female who presents for evaluation and treatment of obesity and related comorbidities. Current BMI is Body mass index is 30.54 kg/m. Brenda Holloway has been struggling with her weight for many years and has been unsuccessful in either losing weight, maintaining weight loss, or reaching her healthy weight goal.  Brenda Holloway started weight lifting 1 month ago and had total replacement of both hips.  Walking 3 miles most days of the week.  Eating 3 meals and 2 snacks per day.  Gets protein with meals and snacks.  High stress at work and wants to snack more.  Denies sugar cravings.  Would like to get down to 20 lbs of weight loss.  Was on testosterone and phentermine for a few months earlier this year. Brenda Holloway is currently in the action stage of change and ready to dedicate time achieving and maintaining a healthier weight. Brenda Holloway is interested in becoming our patient and working on intensive lifestyle modifications including (but not limited to) diet and exercise for weight loss.  Brenda Holloway's habits were reviewed today and are as follows: Her family eats meals together, her desired weight loss is 27-32 lbs, she started gaining weight at age 54, her heaviest weight ever was 203 pounds, she is a picky eater and doesn't like to eat healthier foods, she has significant food cravings issues, she skips meals frequently, she is frequently drinking liquids with calories, she frequently makes poor food choices, she frequently eats larger portions than normal, and she struggles with emotional eating.  Depression Screen Brenda Holloway's Food and Mood (modified PHQ-9) score was 4.     10/11/2021    7:29 AM  Depression screen PHQ 2/9  Decreased Interest 0  Down, Depressed, Hopeless 1  PHQ - 2 Score 1  Altered sleeping 1  Tired, decreased energy 1  Change in appetite 1  Feeling bad or failure about yourself  0  Trouble concentrating 0  Moving slowly or  fidgety/restless 0  Suicidal thoughts 0  PHQ-9 Score 4  Difficult doing work/chores Not difficult at all   Subjective:   1. Other fatigue Brenda Holloway admits to daytime somnolence and denies waking up still tired. Patient has a history of symptoms of daytime fatigue. Brenda Holloway generally gets 5 or 8 hours of sleep per night, and states that she has generally restful sleep. Snoring is present. Apneic episodes are not present. Epworth Sleepiness Score is 2.  Metabolic rate has dropped, likely due to under eating on phentermine.  2. SOBOE (shortness of breath on exertion) Brenda Holloway notes increasing shortness of breath with exercising and seems to be worsening over time with weight gain. She notes getting out of breath sooner with activity than she used to. This has not gotten worse recently. Brenda Holloway denies shortness of breath at rest or orthopnea.  3. Essential hypertension Brenda Holloway's diastolic pressure is elevated.  She is on chlorthalidone 25 mg once daily.  Her blood pressures at home range at 120s/70s.  4. Vitamin D deficiency Brenda Holloway is currently not on a vitamin D supplementation.  Her last DEXA scan was updated in 2022.  5. Insulin resistance Brenda Holloway was on Victoza in the past.  She was not diabetic or prediabetic.  Explained insurance coverage for GLP-1 agonist currently.  6. Other depression with emotional eating Brenda Holloway Wellbutrin in the past, which helped without side effects.  Assessment/Plan:   1. Other fatigue Brenda Holloway does feel that her weight is causing her  energy to be lower than it should be. Fatigue may be related to obesity, depression or many other causes. Labs will be ordered, and in the meanwhile, Brenda Holloway will focus on self care including making healthy food choices, increasing physical activity and focusing on stress reduction.  - EKG 12-Lead  2. SOBOE (shortness of breath on exertion) Brenda Holloway does feel that she gets out of breath more easily that she used to when she exercises. Brenda Holloway's shortness  of breath appears to be obesity related and exercise induced. She has agreed to work on weight loss and gradually increase exercise to treat her exercise induced shortness of breath. Will continue to monitor closely.  3. Essential hypertension Brenda Holloway bring in a copy of her July labs at her next visit.  4. Vitamin D deficiency We will recheck vitamin D level in 2 weeks if they were not done in July 2023  5. Insulin resistance We will recheck fasting insulin and A1c at her next visit if they were not done in July 2023.  6. Other depression with emotional eating Await CMP results. Plan to start Wellbutrin SR 150 mg BID after reviewing labs.   7. Obesity,current BMI 30.6 Brenda Holloway is currently in the action stage of change and her goal is to continue with weight loss efforts. I recommend Brenda Holloway begin the structured treatment plan as follows:  She has agreed to the Category 1 Plan with 100 grams of protein daily.   Wait for labs to be uploaded to [place].  Exercise goals: Cardio and weights for total of 5 days per week.    Behavioral modification strategies: increasing lean protein intake, increasing water intake, decreasing alcohol intake, decreasing eating out, no skipping meals, meal planning and cooking strategies, better snacking choices, and decreasing junk food.  She was informed of the importance of frequent follow-up visits to maximize her success with intensive lifestyle modifications for her multiple health conditions. She was informed we would discuss her lab results at her next visit unless there is a critical issue that needs to be addressed sooner. Brenda Holloway agreed to keep her next visit at the agreed upon time to discuss these results.  Objective:   Blood pressure (!) 137/94, pulse 80, temperature 98 F (36.7 C), height 5\' 2"  (1.575 m), weight 167 lb (75.8 kg), last menstrual period 01/29/2009, SpO2 99 %. Body mass index is 30.54 kg/m.  EKG: Normal sinus rhythm, rate 75  BPM.  Indirect Calorimeter completed today shows a VO2 of 171 and a REE of 1181.  Her calculated basal metabolic rate is 5400 thus her basal metabolic rate is worse than expected.  General: Cooperative, alert, well developed, in no acute distress. HEENT: Conjunctivae and lids unremarkable. Cardiovascular: Regular rhythm.  Lungs: Normal work of breathing. Neurologic: No focal deficits.   Lab Results  Component Value Date   CREATININE 0.78 08/25/2019   BUN 8 08/25/2019   NA 141 08/25/2019   K 3.8 08/25/2019   CL 97 08/25/2019   CO2 27 08/25/2019   Lab Results  Component Value Date   ALT 11 08/25/2019   AST 15 08/25/2019   ALKPHOS 89 08/25/2019   BILITOT 0.5 08/25/2019   Lab Results  Component Value Date   HGBA1C 5.1 08/25/2019   HGBA1C 5.1 03/26/2019   HGBA1C 5.3 11/13/2018   Lab Results  Component Value Date   INSULIN 4.6 08/25/2019   INSULIN 6.6 03/26/2019   INSULIN 13.1 11/13/2018   Lab Results  Component Value Date   TSH 1.250  11/13/2018   Lab Results  Component Value Date   CHOL 192 03/26/2019   HDL 75 03/26/2019   LDLCALC 99 03/26/2019   TRIG 101 03/26/2019   No results found for: "WBC", "HGB", "HCT", "MCV", "PLT" No results found for: "IRON", "TIBC", "FERRITIN"  Attestation Statements:   Reviewed by clinician on day of visit: allergies, medications, problem list, medical history, surgical history, family history, social history, and previous encounter notes.   Trude Mcburney, am acting as transcriptionist for Seymour Bars, DO.  I have reviewed the above documentation for accuracy and completeness, and I agree with the above. Glennis Brink, DO

## 2021-10-22 ENCOUNTER — Encounter (INDEPENDENT_AMBULATORY_CARE_PROVIDER_SITE_OTHER): Payer: Self-pay

## 2021-10-25 ENCOUNTER — Encounter (INDEPENDENT_AMBULATORY_CARE_PROVIDER_SITE_OTHER): Payer: Self-pay

## 2021-10-26 ENCOUNTER — Ambulatory Visit (INDEPENDENT_AMBULATORY_CARE_PROVIDER_SITE_OTHER): Payer: Self-pay | Admitting: Family Medicine

## 2021-10-28 ENCOUNTER — Encounter (INDEPENDENT_AMBULATORY_CARE_PROVIDER_SITE_OTHER): Payer: Self-pay

## 2021-10-30 NOTE — Telephone Encounter (Signed)
Please review

## 2021-11-03 NOTE — Telephone Encounter (Signed)
I have spoken with patient in regard.  I have apologized.  I have also informed her that we would not be assessing a fee or no show.

## 2021-12-26 ENCOUNTER — Other Ambulatory Visit (INDEPENDENT_AMBULATORY_CARE_PROVIDER_SITE_OTHER): Payer: Self-pay | Admitting: Family Medicine

## 2021-12-26 ENCOUNTER — Encounter (INDEPENDENT_AMBULATORY_CARE_PROVIDER_SITE_OTHER): Payer: Self-pay | Admitting: Family Medicine

## 2021-12-26 ENCOUNTER — Ambulatory Visit (INDEPENDENT_AMBULATORY_CARE_PROVIDER_SITE_OTHER): Payer: Managed Care, Other (non HMO) | Admitting: Family Medicine

## 2021-12-26 VITALS — BP 145/91 | HR 71 | Temp 97.7°F | Ht 62.0 in | Wt 164.0 lb

## 2021-12-26 DIAGNOSIS — F3289 Other specified depressive episodes: Secondary | ICD-10-CM

## 2021-12-26 DIAGNOSIS — R948 Abnormal results of function studies of other organs and systems: Secondary | ICD-10-CM | POA: Diagnosis not present

## 2021-12-26 DIAGNOSIS — E669 Obesity, unspecified: Secondary | ICD-10-CM

## 2021-12-26 DIAGNOSIS — Z6830 Body mass index (BMI) 30.0-30.9, adult: Secondary | ICD-10-CM

## 2021-12-26 DIAGNOSIS — I10 Essential (primary) hypertension: Secondary | ICD-10-CM | POA: Diagnosis not present

## 2021-12-26 MED ORDER — CHLORTHALIDONE 25 MG PO TABS: 12.5000 mg | ORAL_TABLET | Freq: Every day | ORAL | 0 refills | Status: DC

## 2021-12-26 MED ORDER — BUPROPION HCL ER (SR) 150 MG PO TB12: 150.0000 mg | ORAL_TABLET | Freq: Two times a day (BID) | ORAL | 0 refills | Status: DC

## 2021-12-30 LAB — TSH RFX ON ABNORMAL TO FREE T4: TSH: 0.783 u[IU]/mL (ref 0.450–4.500)

## 2021-12-30 LAB — COMPREHENSIVE METABOLIC PANEL
ALT: 19 IU/L (ref 0–32)
AST: 20 IU/L (ref 0–40)
Albumin/Globulin Ratio: 2.4 — ABNORMAL HIGH (ref 1.2–2.2)
Albumin: 4.8 g/dL (ref 3.9–4.9)
Alkaline Phosphatase: 84 IU/L (ref 44–121)
BUN/Creatinine Ratio: 19 (ref 12–28)
BUN: 14 mg/dL (ref 8–27)
Bilirubin Total: 0.5 mg/dL (ref 0.0–1.2)
CO2: 22 mmol/L (ref 20–29)
Calcium: 10 mg/dL (ref 8.7–10.3)
Chloride: 97 mmol/L (ref 96–106)
Creatinine, Ser: 0.73 mg/dL (ref 0.57–1.00)
Globulin, Total: 2 g/dL (ref 1.5–4.5)
Glucose: 99 mg/dL (ref 70–99)
Potassium: 3.3 mmol/L — ABNORMAL LOW (ref 3.5–5.2)
Sodium: 137 mmol/L (ref 134–144)
Total Protein: 6.8 g/dL (ref 6.0–8.5)
eGFR: 93 mL/min/{1.73_m2} (ref >59)

## 2021-12-30 LAB — VITAMIN D, 25-HYDROXY, TOTAL: Vitamin D, 25-Hydroxy, Serum: 45 ng/mL

## 2021-12-30 LAB — VITAMIN B12: Vitamin B-12: 638 pg/mL (ref 232–1245)

## 2021-12-30 LAB — HEMOGLOBIN A1C
Est. average glucose Bld gHb Est-mCnc: 108 mg/dL
Hgb A1c MFr Bld: 5.4 % (ref 4.8–5.6)

## 2022-01-04 ENCOUNTER — Encounter (INDEPENDENT_AMBULATORY_CARE_PROVIDER_SITE_OTHER): Payer: Self-pay

## 2022-01-04 NOTE — Progress Notes (Signed)
Chief Complaint:   OBESITY Brenda Holloway is here to discuss her progress with her obesity treatment plan along with follow-up of her obesity related diagnoses. Brenda Holloway is on the Category 1 Plan +100 protein and states she is following her eating plan approximately 0% of the time. Brenda Holloway states she is walking and has a Systems analyst 30-60 minutes 5 times per week.  Today's visit was #: 2 Starting weight: 167 LBS Starting date: 10/11/2021 Today's weight: 164 LBS Today's date: 12/26/2021 Total lbs lost to date: 3 LBS Total lbs lost since last in-office visit: 3 LBS  Interim History: Patient has been on prescription phentermine (prescribed outside our practice), she traveled to [place] where her daughter was in the hospital.  She has a Psychologist, educational.  She plans to start working out on a regular basis.  She plans to start on a meal plan.  She had CBC, CMP done in July.  She is due for labs today.  Subjective:   1. Essential hypertension Brenda Holloway's blood pressure is slightly elevated today.  She is taking chlorthalidone 12.5 mg daily.  Her home blood pressures are <140/90.  2. Low basal metabolic rate REE, done on 10/11/2021 was 1181 kcal per day versus expected 1404 kcals per day.  Likely due to inadequate nutrient intake, she was on phentermine outside of our program.  She has increased protein intake.  3. Other depression with emotional eating Her stress levels are high.  She has good support.  Assessment/Plan:   1. Essential hypertension Continue current blood pressure medication.  Refill- chlorthalidone (HYGROTON) 25 MG tablet; Take 0.5 tablets (12.5 mg total) by mouth daily.  Dispense: 15 tablet; Refill: 0  2. Low basal metabolic rate Discontinue phentermine. Too much anorexia from phentermine was leading to a lack of adequate calories. Aim for 1200 cal/day with 80 g of protein daily.  3. Other depression with emotional eating Consider CBT with Dr. Dewaine Conger.  Begin- buPROPion (WELLBUTRIN  SR) 150 MG 12 hr tablet; Take 1 tablet (150 mg total) by mouth 2 (two) times daily.  Dispense: 60 tablet; Refill: 0  4. Obesity,current BMI 30.1 1) update labs today. 2) began category 1 meal plan and personal training.  - Hemoglobin A1c - TSH Rfx on Abnormal to Free T4 - Vitamin D, 25-Hydroxy, Total - Vitamin B12 - Comprehensive metabolic panel  Brenda Holloway is currently in the action stage of change. As such, her goal is to continue with weight loss efforts. She has agreed to the Category 1 Plan +80 protein daily.  Exercise goals: All adults should avoid inactivity. Some physical activity is better than none, and adults who participate in any amount of physical activity gain some health benefits.  Behavioral modification strategies: increasing lean protein intake, increasing water intake, decreasing liquid calories, decreasing eating out, no skipping meals, meal planning and cooking strategies, keeping healthy foods in the home, and decreasing junk food.  Brenda Holloway has agreed to follow-up with our clinic in 3 weeks. She was informed of the importance of frequent follow-up visits to maximize her success with intensive lifestyle modifications for her multiple health conditions.   Objective:   Blood pressure (!) 145/91, pulse 71, temperature 97.7 F (36.5 C), height 5\' 2"  (1.575 m), weight 164 lb (74.4 kg), last menstrual period 01/29/2009, SpO2 96 %. Body mass index is 30 kg/m.  General: Cooperative, alert, well developed, in no acute distress. HEENT: Conjunctivae and lids unremarkable. Cardiovascular: Regular rhythm.  Lungs: Normal work of breathing. Neurologic: No focal deficits.  Lab Results  Component Value Date   CREATININE 0.73 12/26/2021   BUN 14 12/26/2021   NA 137 12/26/2021   K 3.3 (L) 12/26/2021   CL 97 12/26/2021   CO2 22 12/26/2021   Lab Results  Component Value Date   ALT 19 12/26/2021   AST 20 12/26/2021   ALKPHOS 84 12/26/2021   BILITOT 0.5 12/26/2021   Lab  Results  Component Value Date   HGBA1C 5.4 12/26/2021   HGBA1C 5.1 08/25/2019   HGBA1C 5.1 03/26/2019   HGBA1C 5.3 11/13/2018   Lab Results  Component Value Date   INSULIN 4.6 08/25/2019   INSULIN 6.6 03/26/2019   INSULIN 13.1 11/13/2018   Lab Results  Component Value Date   TSH 0.783 12/26/2021   Lab Results  Component Value Date   CHOL 192 03/26/2019   HDL 75 03/26/2019   LDLCALC 99 03/26/2019   TRIG 101 03/26/2019   Lab Results  Component Value Date   VD25OH 45.8 08/25/2019   VD25OH 48.2 03/26/2019   VD25OH 27.4 (L) 11/13/2018   No results found for: "WBC", "HGB", "HCT", "MCV", "PLT" No results found for: "IRON", "TIBC", "FERRITIN"  Attestation Statements:   Reviewed by clinician on day of visit: allergies, medications, problem list, medical history, surgical history, family history, social history, and previous encounter notes.  I have personally spent 40 minutes total time today in preparation, patient care, and documentation for this visit, including the following: review of clinical lab tests; review of medical tests/procedures/services.    I, Malcolm Metro, am acting as Energy manager for Seymour Bars, DO.  I have reviewed the above documentation for accuracy and completeness, and I agree with the above. Glennis Brink, DO

## 2022-01-08 ENCOUNTER — Other Ambulatory Visit (INDEPENDENT_AMBULATORY_CARE_PROVIDER_SITE_OTHER): Payer: Self-pay | Admitting: Family Medicine

## 2022-01-17 ENCOUNTER — Telehealth (INDEPENDENT_AMBULATORY_CARE_PROVIDER_SITE_OTHER): Payer: Self-pay | Admitting: *Deleted

## 2022-01-17 ENCOUNTER — Encounter (INDEPENDENT_AMBULATORY_CARE_PROVIDER_SITE_OTHER): Payer: Self-pay | Admitting: Family Medicine

## 2022-01-17 ENCOUNTER — Ambulatory Visit (INDEPENDENT_AMBULATORY_CARE_PROVIDER_SITE_OTHER): Payer: Managed Care, Other (non HMO) | Admitting: Family Medicine

## 2022-01-17 VITALS — BP 110/76 | HR 74 | Temp 97.6°F | Ht 62.0 in | Wt 168.0 lb

## 2022-01-17 DIAGNOSIS — F3289 Other specified depressive episodes: Secondary | ICD-10-CM

## 2022-01-17 DIAGNOSIS — I10 Essential (primary) hypertension: Secondary | ICD-10-CM | POA: Diagnosis not present

## 2022-01-17 DIAGNOSIS — E669 Obesity, unspecified: Secondary | ICD-10-CM

## 2022-01-17 DIAGNOSIS — Z6830 Body mass index (BMI) 30.0-30.9, adult: Secondary | ICD-10-CM

## 2022-01-17 DIAGNOSIS — R632 Polyphagia: Secondary | ICD-10-CM | POA: Diagnosis not present

## 2022-01-17 MED ORDER — ZEPBOUND 2.5 MG/0.5ML ~~LOC~~ SOAJ: 2.5000 mg | SUBCUTANEOUS | 0 refills | Status: AC

## 2022-01-17 MED ORDER — CHLORTHALIDONE 25 MG PO TABS: 25.0000 mg | ORAL_TABLET | Freq: Every day | ORAL | 0 refills | Status: AC

## 2022-01-17 NOTE — Telephone Encounter (Signed)
Prior authorization done via cover my meds for patients Zepbound. Per cover my meds medication is covered by insurance and no PA is needed. Patient notified.

## 2022-01-23 ENCOUNTER — Telehealth (INDEPENDENT_AMBULATORY_CARE_PROVIDER_SITE_OTHER): Payer: Self-pay | Admitting: Family Medicine

## 2022-01-23 ENCOUNTER — Encounter (INDEPENDENT_AMBULATORY_CARE_PROVIDER_SITE_OTHER): Payer: Self-pay

## 2022-01-25 NOTE — Progress Notes (Signed)
Chief Complaint:   OBESITY Brenda Holloway is here to discuss her progress with her obesity treatment plan along with follow-up of her obesity related diagnoses. Brenda Holloway is on the Category 1 Plan +80 protein and states she is following her eating plan approximately 50% of the time. Brenda Holloway states she is walking 60 minutes 7 times per week.  Today's visit was #: 3 Starting weight: 167 LBS Starting date: 10/11/2021 Today's weight: 168 LBS Today's date: 01/17/2022 Total lbs lost to date: 0 Total lbs lost since last in-office visit: +4 LBS  Interim History: Patient has been doing a better job with getting protein in with meals.  She has a lot of hunger.  She overeating meals and tends to snack when she is bored and stressed.  Increased water intake.  Got off track in [place] for 1 week.  Subjective:   1. Polyphagia Eating excess refined carbohydrate snacks which are worsening hunger.  2. Essential hypertension Blood pressure is well controlled.  She is taking chorthalidone 25 mg daily.  Denies adverse side effects.   3. Other depression with emotional eating Patient had to stop Wellbutrin due to allergic rash.  Has a good support system.  Assessment/Plan:   1. Polyphagia Increase lean protein intake with meals and snacks. Patient denies a personal or family history of pancreatitis, medullary thyroid carcinoma or multiple endocrine neoplasia type II.Recommend reviewing pen training video online.   Begin- tirzepatide (ZEPBOUND) 2.5 MG/0.5ML Pen; Inject 2.5 mg into the skin once a week.  Dispense: 2 mL; Refill: 0  2. Essential hypertension Refill - chlorthalidone (HYGROTON) 25 MG tablet; Take 1 tablet (25 mg total) by mouth daily.  Dispense: 30 tablet; Refill: 0  3. Other depression with emotional eating Practice mindful eating, stress reduction. improve sleep quality at night.  4. Obesity,current BMI 30.8 1.  Add weight training 2 times per week. 2.  Resume meal plan.  Begin-  tirzepatide (ZEPBOUND) 2.5 MG/0.5ML Pen; Inject 2.5 mg into the skin once a week.  Dispense: 2 mL; Refill: 0  Brenda Holloway is currently in the action stage of change. As such, her goal is to continue with weight loss efforts. She has agreed to the Category 1 Plan +80 protein daily.  Exercise goals:  As is.  Behavioral modification strategies: increasing lean protein intake, decreasing simple carbohydrates, increasing vegetables, increasing water intake, decreasing eating out, no skipping meals, meal planning and cooking strategies, keeping healthy foods in the home, better snacking choices, holiday eating strategies , and celebration eating strategies.  Brenda Holloway has agreed to follow-up with our clinic in 4 weeks. She was informed of the importance of frequent follow-up visits to maximize her success with intensive lifestyle modifications for her multiple health conditions.   Objective:   Blood pressure 110/76, pulse 74, temperature 97.6 F (36.4 C), height 5\' 2"  (1.575 m), weight 168 lb (76.2 kg), last menstrual period 01/29/2009, SpO2 100 %. Body mass index is 30.73 kg/m.  General: Cooperative, alert, well developed, in no acute distress. HEENT: Conjunctivae and lids unremarkable. Cardiovascular: Regular rhythm.  Lungs: Normal work of breathing. Neurologic: No focal deficits.   Lab Results  Component Value Date   CREATININE 0.73 12/26/2021   BUN 14 12/26/2021   NA 137 12/26/2021   K 3.3 (L) 12/26/2021   CL 97 12/26/2021   CO2 22 12/26/2021   Lab Results  Component Value Date   ALT 19 12/26/2021   AST 20 12/26/2021   ALKPHOS 84 12/26/2021   BILITOT 0.5  12/26/2021   Lab Results  Component Value Date   HGBA1C 5.4 12/26/2021   HGBA1C 5.1 08/25/2019   HGBA1C 5.1 03/26/2019   HGBA1C 5.3 11/13/2018   Lab Results  Component Value Date   INSULIN 4.6 08/25/2019   INSULIN 6.6 03/26/2019   INSULIN 13.1 11/13/2018   Lab Results  Component Value Date   TSH 0.783 12/26/2021   Lab  Results  Component Value Date   CHOL 192 03/26/2019   HDL 75 03/26/2019   LDLCALC 99 03/26/2019   TRIG 101 03/26/2019   Lab Results  Component Value Date   VD25OH 45.8 08/25/2019   VD25OH 48.2 03/26/2019   VD25OH 27.4 (L) 11/13/2018   No results found for: "WBC", "HGB", "HCT", "MCV", "PLT" No results found for: "IRON", "TIBC", "FERRITIN"  Attestation Statements:   Reviewed by clinician on day of visit: allergies, medications, problem list, medical history, surgical history, family history, social history, and previous encounter notes.  I, Davy Pique, am acting as Location manager for Loyal Gambler, DO.  I have reviewed the above documentation for accuracy and completeness, and I agree with the above. Dell Ponto, DO

## 2022-02-12 ENCOUNTER — Ambulatory Visit (INDEPENDENT_AMBULATORY_CARE_PROVIDER_SITE_OTHER): Payer: Managed Care, Other (non HMO) | Admitting: Adult Health

## 2022-05-10 ENCOUNTER — Other Ambulatory Visit (INDEPENDENT_AMBULATORY_CARE_PROVIDER_SITE_OTHER): Payer: Self-pay | Admitting: Family Medicine

## 2022-05-10 DIAGNOSIS — I10 Essential (primary) hypertension: Secondary | ICD-10-CM

## 2022-09-17 DIAGNOSIS — H5213 Myopia, bilateral: Secondary | ICD-10-CM | POA: Diagnosis not present

## 2022-11-09 DIAGNOSIS — Z1231 Encounter for screening mammogram for malignant neoplasm of breast: Secondary | ICD-10-CM | POA: Diagnosis not present

## 2022-11-26 DIAGNOSIS — R509 Fever, unspecified: Secondary | ICD-10-CM | POA: Diagnosis not present

## 2022-11-26 DIAGNOSIS — J020 Streptococcal pharyngitis: Secondary | ICD-10-CM | POA: Diagnosis not present

## 2023-01-08 DIAGNOSIS — G479 Sleep disorder, unspecified: Secondary | ICD-10-CM | POA: Diagnosis not present

## 2023-01-08 DIAGNOSIS — Z13220 Encounter for screening for lipoid disorders: Secondary | ICD-10-CM | POA: Diagnosis not present

## 2023-01-08 DIAGNOSIS — I10 Essential (primary) hypertension: Secondary | ICD-10-CM | POA: Diagnosis not present

## 2023-01-08 DIAGNOSIS — Z0000 Encounter for general adult medical examination without abnormal findings: Secondary | ICD-10-CM | POA: Diagnosis not present

## 2023-02-01 DIAGNOSIS — D75839 Thrombocytosis, unspecified: Secondary | ICD-10-CM | POA: Diagnosis not present

## 2023-02-19 DIAGNOSIS — H04123 Dry eye syndrome of bilateral lacrimal glands: Secondary | ICD-10-CM | POA: Diagnosis not present

## 2023-02-19 DIAGNOSIS — H1045 Other chronic allergic conjunctivitis: Secondary | ICD-10-CM | POA: Diagnosis not present

## 2023-02-19 DIAGNOSIS — H04202 Unspecified epiphora, left lacrimal gland: Secondary | ICD-10-CM | POA: Diagnosis not present

## 2023-02-19 DIAGNOSIS — H2513 Age-related nuclear cataract, bilateral: Secondary | ICD-10-CM | POA: Diagnosis not present

## 2023-02-28 DIAGNOSIS — N951 Menopausal and female climacteric states: Secondary | ICD-10-CM | POA: Diagnosis not present

## 2023-02-28 DIAGNOSIS — E039 Hypothyroidism, unspecified: Secondary | ICD-10-CM | POA: Diagnosis not present

## 2023-03-05 DIAGNOSIS — E039 Hypothyroidism, unspecified: Secondary | ICD-10-CM | POA: Diagnosis not present

## 2023-03-05 DIAGNOSIS — Z6827 Body mass index (BMI) 27.0-27.9, adult: Secondary | ICD-10-CM | POA: Diagnosis not present

## 2023-03-05 DIAGNOSIS — N951 Menopausal and female climacteric states: Secondary | ICD-10-CM | POA: Diagnosis not present

## 2023-04-08 NOTE — Telephone Encounter (Signed)
 N/a error sch

## 2023-04-09 DIAGNOSIS — E559 Vitamin D deficiency, unspecified: Secondary | ICD-10-CM | POA: Diagnosis not present

## 2023-04-09 DIAGNOSIS — E039 Hypothyroidism, unspecified: Secondary | ICD-10-CM | POA: Diagnosis not present

## 2023-04-09 DIAGNOSIS — N951 Menopausal and female climacteric states: Secondary | ICD-10-CM | POA: Diagnosis not present

## 2023-04-11 DIAGNOSIS — E039 Hypothyroidism, unspecified: Secondary | ICD-10-CM | POA: Diagnosis not present

## 2023-04-11 DIAGNOSIS — N951 Menopausal and female climacteric states: Secondary | ICD-10-CM | POA: Diagnosis not present

## 2023-04-18 DIAGNOSIS — B029 Zoster without complications: Secondary | ICD-10-CM | POA: Diagnosis not present

## 2023-05-08 DIAGNOSIS — H02844 Edema of left upper eyelid: Secondary | ICD-10-CM | POA: Diagnosis not present

## 2023-05-08 DIAGNOSIS — H0288A Meibomian gland dysfunction right eye, upper and lower eyelids: Secondary | ICD-10-CM | POA: Diagnosis not present

## 2023-05-08 DIAGNOSIS — H0288B Meibomian gland dysfunction left eye, upper and lower eyelids: Secondary | ICD-10-CM | POA: Diagnosis not present

## 2023-05-08 DIAGNOSIS — L309 Dermatitis, unspecified: Secondary | ICD-10-CM | POA: Diagnosis not present

## 2023-05-08 DIAGNOSIS — B0239 Other herpes zoster eye disease: Secondary | ICD-10-CM | POA: Diagnosis not present

## 2023-05-22 DIAGNOSIS — E039 Hypothyroidism, unspecified: Secondary | ICD-10-CM | POA: Diagnosis not present

## 2023-05-22 DIAGNOSIS — N951 Menopausal and female climacteric states: Secondary | ICD-10-CM | POA: Diagnosis not present
